# Patient Record
Sex: Female | Born: 1945
Health system: Southern US, Community
[De-identification: ages and names within clinical notes are randomized; demographics above are authoritative.]

## PROBLEM LIST (undated history)

## (undated) DIAGNOSIS — Z8489 Family history of other specified conditions: Secondary | ICD-10-CM

## (undated) DIAGNOSIS — J302 Other seasonal allergic rhinitis: Secondary | ICD-10-CM

## (undated) DIAGNOSIS — E785 Hyperlipidemia, unspecified: Secondary | ICD-10-CM

## (undated) DIAGNOSIS — I82409 Acute embolism and thrombosis of unspecified deep veins of unspecified lower extremity: Secondary | ICD-10-CM

## (undated) DIAGNOSIS — C801 Malignant (primary) neoplasm, unspecified: Secondary | ICD-10-CM

## (undated) DIAGNOSIS — M1990 Unspecified osteoarthritis, unspecified site: Secondary | ICD-10-CM

## (undated) DIAGNOSIS — F32A Depression, unspecified: Secondary | ICD-10-CM

## (undated) DIAGNOSIS — I10 Essential (primary) hypertension: Secondary | ICD-10-CM

## (undated) DIAGNOSIS — F329 Major depressive disorder, single episode, unspecified: Secondary | ICD-10-CM

## (undated) DIAGNOSIS — E119 Type 2 diabetes mellitus without complications: Secondary | ICD-10-CM

## (undated) DIAGNOSIS — I82401 Acute embolism and thrombosis of unspecified deep veins of right lower extremity: Secondary | ICD-10-CM

## (undated) HISTORY — DX: Type 2 diabetes mellitus without complications: E11.9

## (undated) HISTORY — PX: TONSILLECTOMY: SUR1361

## (undated) HISTORY — DX: Major depressive disorder, single episode, unspecified: F32.9

## (undated) HISTORY — PX: COLONOSCOPY W/ POLYPECTOMY: SHX1380

## (undated) HISTORY — DX: Hyperlipidemia, unspecified: E78.5

## (undated) HISTORY — DX: Essential (primary) hypertension: I10

## (undated) HISTORY — PX: OTHER SURGICAL HISTORY: SHX169

## (undated) HISTORY — DX: Depression, unspecified: F32.A

## (undated) HISTORY — DX: Acute embolism and thrombosis of unspecified deep veins of unspecified lower extremity: I82.409

## (undated) HISTORY — DX: Acute embolism and thrombosis of unspecified deep veins of right lower extremity: I82.401

---

## 1967-12-17 HISTORY — PX: DILATION AND CURETTAGE OF UTERUS: SHX78

## 1973-12-16 HISTORY — PX: VAGINAL HYSTERECTOMY: SUR661

## 1990-12-16 HISTORY — PX: LUMBAR SPINE SURGERY: SHX701

## 2001-06-12 ENCOUNTER — Ambulatory Visit (HOSPITAL_COMMUNITY): Admission: RE | Admit: 2001-06-12 | Discharge: 2001-06-12 | Payer: Self-pay | Admitting: Family Medicine

## 2001-06-12 ENCOUNTER — Encounter: Payer: Self-pay | Admitting: Family Medicine

## 2002-11-04 ENCOUNTER — Encounter: Payer: Self-pay | Admitting: Family Medicine

## 2002-11-04 ENCOUNTER — Ambulatory Visit (HOSPITAL_COMMUNITY): Admission: RE | Admit: 2002-11-04 | Discharge: 2002-11-04 | Payer: Self-pay | Admitting: Physical Therapy

## 2004-04-02 ENCOUNTER — Ambulatory Visit (HOSPITAL_COMMUNITY): Admission: RE | Admit: 2004-04-02 | Discharge: 2004-04-02 | Payer: Self-pay | Admitting: Family Medicine

## 2004-04-11 ENCOUNTER — Ambulatory Visit (HOSPITAL_COMMUNITY): Admission: RE | Admit: 2004-04-11 | Discharge: 2004-04-11 | Payer: Self-pay | Admitting: Family Medicine

## 2005-06-24 ENCOUNTER — Ambulatory Visit (HOSPITAL_COMMUNITY): Admission: RE | Admit: 2005-06-24 | Discharge: 2005-06-24 | Payer: Self-pay | Admitting: Family Medicine

## 2006-07-02 ENCOUNTER — Ambulatory Visit (HOSPITAL_COMMUNITY): Admission: RE | Admit: 2006-07-02 | Discharge: 2006-07-02 | Payer: Self-pay | Admitting: Family Medicine

## 2007-08-04 ENCOUNTER — Ambulatory Visit (HOSPITAL_COMMUNITY): Admission: RE | Admit: 2007-08-04 | Discharge: 2007-08-04 | Payer: Self-pay | Admitting: Family Medicine

## 2008-10-19 ENCOUNTER — Ambulatory Visit (HOSPITAL_COMMUNITY): Admission: RE | Admit: 2008-10-19 | Discharge: 2008-10-19 | Payer: Self-pay | Admitting: Family Medicine

## 2008-11-03 ENCOUNTER — Ambulatory Visit (HOSPITAL_COMMUNITY): Admission: RE | Admit: 2008-11-03 | Discharge: 2008-11-03 | Payer: Self-pay | Admitting: Family Medicine

## 2011-01-06 ENCOUNTER — Encounter: Payer: Self-pay | Admitting: Family Medicine

## 2013-09-15 ENCOUNTER — Other Ambulatory Visit (HOSPITAL_COMMUNITY): Payer: Self-pay | Admitting: Family Medicine

## 2013-09-15 DIAGNOSIS — Z139 Encounter for screening, unspecified: Secondary | ICD-10-CM

## 2013-09-21 ENCOUNTER — Ambulatory Visit (HOSPITAL_COMMUNITY)
Admission: RE | Admit: 2013-09-21 | Discharge: 2013-09-21 | Disposition: A | Discharge status: Home / Self Care | Payer: Medicare Other | Source: Ambulatory Visit | Attending: Family Medicine | Admitting: Family Medicine

## 2013-09-21 DIAGNOSIS — Z139 Encounter for screening, unspecified: Secondary | ICD-10-CM

## 2013-09-21 DIAGNOSIS — Z1231 Encounter for screening mammogram for malignant neoplasm of breast: Secondary | ICD-10-CM | POA: Insufficient documentation

## 2015-02-03 ENCOUNTER — Other Ambulatory Visit (HOSPITAL_COMMUNITY): Payer: Self-pay | Admitting: Family Medicine

## 2015-02-03 DIAGNOSIS — Z1231 Encounter for screening mammogram for malignant neoplasm of breast: Secondary | ICD-10-CM

## 2015-02-09 ENCOUNTER — Ambulatory Visit (HOSPITAL_COMMUNITY)
Admission: RE | Admit: 2015-02-09 | Discharge: 2015-02-09 | Disposition: A | Discharge status: Home / Self Care | Payer: Medicare Other | Source: Ambulatory Visit | Attending: Family Medicine | Admitting: Family Medicine

## 2015-02-09 DIAGNOSIS — Z1231 Encounter for screening mammogram for malignant neoplasm of breast: Secondary | ICD-10-CM | POA: Diagnosis not present

## 2015-02-10 ENCOUNTER — Other Ambulatory Visit: Payer: Self-pay | Admitting: Family Medicine

## 2015-02-10 DIAGNOSIS — R928 Other abnormal and inconclusive findings on diagnostic imaging of breast: Secondary | ICD-10-CM

## 2015-02-21 ENCOUNTER — Ambulatory Visit (HOSPITAL_COMMUNITY)
Admission: RE | Admit: 2015-02-21 | Discharge: 2015-02-21 | Disposition: A | Discharge status: Home / Self Care | Payer: Medicare Other | Source: Ambulatory Visit | Attending: Family Medicine | Admitting: Family Medicine

## 2015-02-21 ENCOUNTER — Other Ambulatory Visit: Payer: Self-pay | Admitting: Family Medicine

## 2015-02-21 DIAGNOSIS — R928 Other abnormal and inconclusive findings on diagnostic imaging of breast: Secondary | ICD-10-CM

## 2015-02-21 DIAGNOSIS — N641 Fat necrosis of breast: Secondary | ICD-10-CM | POA: Diagnosis not present

## 2015-10-06 ENCOUNTER — Ambulatory Visit: Payer: Medicare Other | Admitting: Cardiovascular Disease

## 2015-10-12 ENCOUNTER — Ambulatory Visit (INDEPENDENT_AMBULATORY_CARE_PROVIDER_SITE_OTHER): Payer: Medicare Other | Admitting: Cardiology

## 2015-10-12 ENCOUNTER — Encounter: Payer: Self-pay | Admitting: Cardiology

## 2015-10-12 VITALS — BP 104/68 | HR 73 | Ht 68.0 in | Wt 231.2 lb

## 2015-10-12 DIAGNOSIS — Z01810 Encounter for preprocedural cardiovascular examination: Secondary | ICD-10-CM | POA: Diagnosis not present

## 2015-10-12 DIAGNOSIS — I10 Essential (primary) hypertension: Secondary | ICD-10-CM | POA: Diagnosis not present

## 2015-10-12 DIAGNOSIS — E119 Type 2 diabetes mellitus without complications: Secondary | ICD-10-CM

## 2015-10-12 DIAGNOSIS — R072 Precordial pain: Secondary | ICD-10-CM | POA: Diagnosis not present

## 2015-10-12 DIAGNOSIS — R0609 Other forms of dyspnea: Secondary | ICD-10-CM | POA: Diagnosis not present

## 2015-10-12 DIAGNOSIS — R9431 Abnormal electrocardiogram [ECG] [EKG]: Secondary | ICD-10-CM

## 2015-10-12 DIAGNOSIS — E782 Mixed hyperlipidemia: Secondary | ICD-10-CM

## 2015-10-12 NOTE — Progress Notes (Signed)
Cardiology Office Note  Date: 10/12/2015   ID: Sarah Nguyen, DOB Apr 19, 1946, MRN [id number]  PCP: Jalene Mullet, PA-C  Consulting Cardiologist: [name], MD   Chief Complaint  Patient presents with  . Preoperative cardiac evaluation    History of Present Illness: Sarah Nguyen is a 69 y.o. female referred for cardiology consultation by Ms. Georgianne Fick. Preoperative cardiac evaluation has also been requested by [facility]. Records indicate plans for the patient to undergo lumbar laminectomy and foraminotomy with Dr. Ellene Route due to lumbar spinal stenosis. This procedure has not yet been scheduled.  She reports having had trouble since the early summer with fatigue and increasing dyspnea on exertion as well as intermittent chest tightness. She initially attributed this to the hot weather, but symptoms have not completely resolved. She has also had a dry cough. Her primary care provider has been evaluating the symptoms and recently obtained an echocardiogram which is reviewed below. We discussed the results today. Overall the test was reassuring, she does have mild LVH and mild diastolic dysfunction which would be consistent with her history of hypertension and type 2 diabetes mellitus, and does not necessarily explain her symptoms.  She does not endorse any known history of CAD or myocardial infarction. The only history includes diabetes and stroke, but not necessarily premature CAD based on discussion today. She has not undergone recent ischemic evaluation based on record review.  She is limited by lower back pain and spinal stenosis, cannot ambulate consistently for any long distance.  We reviewed her medications which are outlined below. Most recent addition was metoprolol. She states that her cough has resolved.   Past Medical History  Diagnosis Date  . Type 2 diabetes mellitus ([place])   . Hyperlipidemia   . Essential hypertension   . Depression     Past  Surgical History  Procedure Laterality Date  . Lumbar spine surgery  1992  . Vaginal hysterectomy  1975  . Tonsillectomy  69 years old  . Ingrown toenail removal  69 years old  . Dilation and curettage of uterus  1969    Current Outpatient Prescriptions  Medication Sig Dispense Refill  . atorvastatin (LIPITOR) 20 MG tablet Take 20 mg by mouth daily.    Marland Kitchen losartan-hydrochlorothiazide (HYZAAR) 50-12.5 MG tablet Take 1 tablet by mouth daily.    . metFORMIN (GLUCOPHAGE) 500 MG tablet Take 500 mg by mouth 2 (two) times daily with a meal.    . metoprolol (LOPRESSOR) 50 MG tablet Take 50 mg by mouth 2 (two) times daily.     No current facility-administered medications for this visit.    Allergies:  Codeine   Social History: The patient  reports that she has never smoked. She does not have any smokeless tobacco history on file. She reports that she does not drink alcohol or use illicit drugs.   Family History: The patient's family history includes Arrhythmia in her mother; Cancer in her mother; Diabetes Mellitus II in her mother; Melanoma in her father; Stroke in her brother and mother.   ROS:  Please see the history of present illness. Otherwise, complete review of systems is positive for back pain.  All other systems are reviewed and negative.   Physical Exam: VS:  BP 104/68 mmHg  Pulse 73  Ht 5\' 8"  (1.727 m)  Wt 231 lb 3.2 oz (104.872 kg)  BMI 35.16 kg/m2  SpO2 92%, BMI Body mass index is 35.16 kg/(m^2).  Wt Readings from  Last 3 Encounters:  10/12/15 231 lb 3.2 oz (104.872 kg)     General: Overweight woman, appears comfortable at rest. HEENT: Conjunctiva and lids normal, oropharynx clear. Neck: Supple, no elevated JVP or carotid bruits, no thyromegaly. Lungs: Clear to auscultation, nonlabored breathing at rest. Cardiac: Regular rate and rhythm, no S3 or significant systolic murmur, no pericardial rub. Abdomen: Soft, nontender, bowel sounds present, no guarding or  rebound. Extremities: No pitting edema, distal pulses 2+. Skin: Warm and dry. Musculoskeletal: No kyphosis. Neuropsychiatric: Alert and oriented x3, affect grossly appropriate.   ECG: Copy of tracing from 09/20/2015 shows sinus rhythm with decreased R wave progression.  Recent Labwork:  September 2016: Hemoglobin 13.0, platelets 272, TSH 2.5.  Other Studies Reviewed Today:  Echocardiogram 09/26/2015 [facility]) reported mild LVH with LVEF 62-26%, grade 1 diastolic dysfunction, mild aortic sclerosis without stenosis, MAC with trace mitral regurgitation, trace tricuspid regurgitation, unable to assess RVSP, no pericardial effusion.  ASSESSMENT AND PLAN:  1. Preoperative evaluation in a 69 year old woman with hypertension, type 2 diabetes mellitus, hyperlipidemia, and recent symptoms including fatigue, exertional shortness of breath, and intermittent chest tightness. Recent echocardiogram shows mild LVH with mild diastolic dysfunction and preserved LVEF, no significant valvular abnormalities. ECG abnormal with decreased R wave progression, but no history of known CAD or myocardial infarction. To better clarify her symptoms, we discussed proceeding with formal ischemic evaluation. A Lexiscan Cardiolite will be arranged, she has spinal stenosis and would have difficulty managing the treadmill at this time.  2. Essential hypertension, blood pressure control is good today.  3. Type 2 diabetes mellitus, on metformin.  4. Hyperlipidemia, on Lipitor.  Current medicines were reviewed at length with the patient today.   Orders Placed This Encounter  Procedures  . Myocardial Perfusion Imaging    Disposition: Call with results.   Signed, Satira Sark, MD, [facility] 10/12/2015 10:16 AM    [place] [facility] [address] [place], [place] [address] Phone: [phone]; Fax: [phone]

## 2015-10-12 NOTE — Patient Instructions (Signed)
Your physician recommends that you schedule a follow-up appointment in: to be determined after testing. We will call you with results   Your physician recommends that you continue on your current medications as directed. Please refer to the Current Medication list given to you today.   If you need a refill on your cardiac medications before your next appointment, please call your pharmacy.   Your physician has requested that you have a lexiscan myoview. For further information please visit [email or web address]. Please follow instruction sheet, as given.    Thank you for choosing [facility] !

## 2015-10-18 ENCOUNTER — Other Ambulatory Visit: Payer: Self-pay | Admitting: *Deleted

## 2015-10-18 DIAGNOSIS — R0789 Other chest pain: Secondary | ICD-10-CM

## 2015-10-19 ENCOUNTER — Encounter (HOSPITAL_COMMUNITY)
Admission: RE | Admit: 2015-10-19 | Discharge: 2015-10-19 | Disposition: A | Discharge status: Home / Self Care | Payer: Medicare Other | Source: Ambulatory Visit | Attending: Cardiology | Admitting: Cardiology

## 2015-10-19 ENCOUNTER — Encounter (HOSPITAL_COMMUNITY): Payer: Self-pay

## 2015-10-19 ENCOUNTER — Inpatient Hospital Stay (HOSPITAL_COMMUNITY): Admission: RE | Admit: 2015-10-19 | Payer: Medicare Other | Source: Ambulatory Visit

## 2015-10-19 DIAGNOSIS — R0789 Other chest pain: Secondary | ICD-10-CM | POA: Diagnosis present

## 2015-10-19 LAB — NM MYOCAR MULTI W/SPECT W/WALL MOTION / EF
CHL CUP NUCLEAR SDS: 1
CHL CUP NUCLEAR SRS: 0
CHL CUP RESTING HR STRESS: 75 {beats}/min
LHR: 0.34
LV sys vol: 25 mL
LVDIAVOL: 64 mL
Peak HR: 109 {beats}/min
SSS: 1
TID: 1.16

## 2015-10-19 MED ORDER — SODIUM CHLORIDE 0.9 % IJ SOLN
INTRAMUSCULAR | Status: AC
  Administered 2015-10-19: 10 mL via INTRAVENOUS
  Filled 2015-10-19: qty 3

## 2015-10-19 MED ORDER — REGADENOSON 0.4 MG/5ML IV SOLN
INTRAVENOUS | Status: AC
  Administered 2015-10-19: 0.4 mg via INTRAVENOUS
  Filled 2015-10-19: qty 5

## 2015-10-19 MED ORDER — TECHNETIUM TC 99M SESTAMIBI - CARDIOLITE
10.0000 | Freq: Once | INTRAVENOUS | Status: AC | PRN
  Administered 2015-10-19: 9.4 via INTRAVENOUS

## 2015-10-19 MED ORDER — TECHNETIUM TC 99M SESTAMIBI GENERIC - CARDIOLITE
30.0000 | Freq: Once | INTRAVENOUS | Status: AC | PRN
  Administered 2015-10-19: 31.8 via INTRAVENOUS

## 2015-10-20 ENCOUNTER — Telehealth: Payer: Self-pay | Admitting: *Deleted

## 2015-10-20 NOTE — Telephone Encounter (Signed)
-----   Message from Satira Sark, MD sent at 10/19/2015  3:42 PM EDT ----- Reviewed report. Please let patient know that the stress test looked good, low risk results. This suggests that she be able to proceed with planned surgery at an acceptable, low perioperative cardiac risk. Please forward copy to Dr. Ellene Route and also PCP.

## 2015-10-20 NOTE — Telephone Encounter (Signed)
Patient informed and copy sent to Dr. Ellene Route and PCP.

## 2016-01-30 DIAGNOSIS — N189 Chronic kidney disease, unspecified: Secondary | ICD-10-CM | POA: Diagnosis not present

## 2016-01-30 DIAGNOSIS — R5382 Chronic fatigue, unspecified: Secondary | ICD-10-CM | POA: Diagnosis not present

## 2016-01-30 DIAGNOSIS — E119 Type 2 diabetes mellitus without complications: Secondary | ICD-10-CM | POA: Diagnosis not present

## 2016-01-30 DIAGNOSIS — I10 Essential (primary) hypertension: Secondary | ICD-10-CM | POA: Diagnosis not present

## 2016-01-30 DIAGNOSIS — E7800 Pure hypercholesterolemia, unspecified: Secondary | ICD-10-CM | POA: Diagnosis not present

## 2016-02-13 DIAGNOSIS — R05 Cough: Secondary | ICD-10-CM | POA: Diagnosis not present

## 2016-02-13 DIAGNOSIS — N189 Chronic kidney disease, unspecified: Secondary | ICD-10-CM | POA: Diagnosis not present

## 2016-02-13 DIAGNOSIS — Z6836 Body mass index (BMI) 36.0-36.9, adult: Secondary | ICD-10-CM | POA: Diagnosis not present

## 2016-02-13 DIAGNOSIS — M2550 Pain in unspecified joint: Secondary | ICD-10-CM | POA: Diagnosis not present

## 2016-02-13 DIAGNOSIS — E118 Type 2 diabetes mellitus with unspecified complications: Secondary | ICD-10-CM | POA: Diagnosis not present

## 2016-02-13 DIAGNOSIS — I10 Essential (primary) hypertension: Secondary | ICD-10-CM | POA: Diagnosis not present

## 2016-02-13 DIAGNOSIS — E119 Type 2 diabetes mellitus without complications: Secondary | ICD-10-CM | POA: Diagnosis not present

## 2016-02-13 DIAGNOSIS — R1310 Dysphagia, unspecified: Secondary | ICD-10-CM | POA: Diagnosis not present

## 2016-02-13 DIAGNOSIS — F321 Major depressive disorder, single episode, moderate: Secondary | ICD-10-CM | POA: Diagnosis not present

## 2016-02-15 ENCOUNTER — Other Ambulatory Visit (HOSPITAL_COMMUNITY): Payer: Self-pay | Admitting: Family Medicine

## 2016-02-15 DIAGNOSIS — R1310 Dysphagia, unspecified: Secondary | ICD-10-CM

## 2016-02-20 ENCOUNTER — Other Ambulatory Visit: Payer: Self-pay | Admitting: Neurological Surgery

## 2016-02-22 ENCOUNTER — Other Ambulatory Visit (HOSPITAL_COMMUNITY): Payer: Medicare Other

## 2016-02-29 ENCOUNTER — Other Ambulatory Visit (HOSPITAL_COMMUNITY): Payer: Medicare Other

## 2016-03-07 DIAGNOSIS — M4806 Spinal stenosis, lumbar region: Secondary | ICD-10-CM | POA: Diagnosis not present

## 2016-03-07 DIAGNOSIS — Z6835 Body mass index (BMI) 35.0-35.9, adult: Secondary | ICD-10-CM | POA: Diagnosis not present

## 2016-03-15 NOTE — Pre-Procedure Instructions (Signed)
Sarah Nguyen  03/15/2016     Your procedure is scheduled on : Tuesday March 26, 2016 at 12:05 PM.  Report to [facility] Admitting at 9:15 AM.  Call this number if you have problems the morning of surgery: [phone]    Remember:  Do not eat food or drink liquids after midnight.  Take these medicines the morning of surgery with A SIP OF WATER : NONE   Stop taking any vitamins, herbal medications, NSAIDs, Ibuprofen, Advil, Motrin, Aleve, etc on Tuesday April 4th   Do  NOT take any diabetic pills the morning of your surgery (NO Metformin/Glucophage)     How to Manage Your Diabetes Before and After Surgery  Why is it important to control my blood sugar before and after surgery? . Improving blood sugar levels before and after surgery helps healing and can limit problems. . A way of improving blood sugar control is eating a healthy diet by: o  Eating less sugar and carbohydrates o  Increasing activity/exercise o  Talking with your doctor about reaching your blood sugar goals . High blood sugars (greater than 180 mg/dL) can raise your risk of infections and slow your recovery, so you will need to focus on controlling your diabetes during the weeks before surgery. . Make sure that the doctor who takes care of your diabetes knows about your planned surgery including the date and location.  How do I manage my blood sugar before surgery? . Check your blood sugar at least 4 times a day, starting 2 days before surgery, to make sure that the level is not too high or low. o Check your blood sugar the morning of your surgery when you wake up and every 2 hours until you get to the Short Stay unit. . If your blood sugar is less than 70 mg/dL, you will need to treat for low blood sugar: o Do not take insulin. o Treat a low blood sugar (less than 70 mg/dL) with  cup of clear juice (cranberry or apple), 4 glucose tablets, OR glucose gel. o Recheck blood sugar in 15 minutes after  treatment (to make sure it is greater than 70 mg/dL). If your blood sugar is not greater than 70 mg/dL on recheck, call [phone] for further instructions. . Report your blood sugar to the short stay nurse when you get to Short Stay.  . If you are admitted to the hospital after surgery: o Your blood sugar will be checked by the staff and you will probably be given insulin after surgery (instead of oral diabetes medicines) to make sure you have good blood sugar levels. o The goal for blood sugar control after surgery is 80-180 mg/dL.      WHAT DO I DO ABOUT MY DIABETES MEDICATION?   Marland Kitchen Do not take oral diabetes medicines (pills) the morning of surgery.   Other Instructions:          Patient Signature:  Date:   Nurse Signature:  Date:   Reviewed and Endorsed by [facility] Patient Education Committee, August 2015   Do not wear jewelry, make-up or nail polish.  Do not wear lotions, powders, or perfumes.    Do not shave 48 hours prior to surgery.    Do not bring valuables to the hospital.  [facility] is not responsible for any belongings or valuables.  Contacts, dentures or bridgework may not be worn into surgery.  Leave your suitcase in the car.  After surgery it may  be brought to your room.  For patients admitted to the hospital, discharge time will be determined by your treatment team.  Patients discharged the day of surgery will not be allowed to drive home.   Name and phone number of your driver:    Special instructions:  Shower using CHG soap the night before and the morning of your surgery  Please read over the following fact sheets that you were given. Pain Booklet, Coughing and Deep Breathing, MRSA Information and Surgical Site Infection Prevention

## 2016-03-18 ENCOUNTER — Encounter (HOSPITAL_COMMUNITY)
Admission: RE | Admit: 2016-03-18 | Discharge: 2016-03-18 | Disposition: A | Discharge status: Home / Self Care | Payer: PPO | Source: Ambulatory Visit | Attending: Neurological Surgery | Admitting: Neurological Surgery

## 2016-03-18 ENCOUNTER — Encounter (HOSPITAL_COMMUNITY): Payer: Self-pay

## 2016-03-18 DIAGNOSIS — M4806 Spinal stenosis, lumbar region: Secondary | ICD-10-CM | POA: Insufficient documentation

## 2016-03-18 DIAGNOSIS — Z01812 Encounter for preprocedural laboratory examination: Secondary | ICD-10-CM | POA: Insufficient documentation

## 2016-03-18 HISTORY — DX: Unspecified osteoarthritis, unspecified site: M19.90

## 2016-03-18 HISTORY — DX: Other seasonal allergic rhinitis: J30.2

## 2016-03-18 HISTORY — DX: Family history of other specified conditions: Z84.89

## 2016-03-18 HISTORY — DX: Malignant (primary) neoplasm, unspecified: C80.1

## 2016-03-18 LAB — BASIC METABOLIC PANEL
ANION GAP: 14 (ref 5–15)
BUN: 17 mg/dL (ref 6–20)
CALCIUM: 9.3 mg/dL (ref 8.9–10.3)
CO2: 22 mmol/L (ref 22–32)
Chloride: 106 mmol/L (ref 101–111)
Creatinine, Ser: 0.97 mg/dL (ref 0.44–1.00)
GFR, EST NON AFRICAN AMERICAN: 59 mL/min — AB (ref >60)
Glucose, Bld: 130 mg/dL — ABNORMAL HIGH (ref 65–99)
Potassium: 4 mmol/L (ref 3.5–5.1)
Sodium: 142 mmol/L (ref 135–145)

## 2016-03-18 LAB — GLUCOSE, CAPILLARY: GLUCOSE-CAPILLARY: 136 mg/dL — AB (ref 65–99)

## 2016-03-18 LAB — CBC
HEMATOCRIT: 41.8 % (ref 36.0–46.0)
Hemoglobin: 13.2 g/dL (ref 12.0–15.0)
MCH: 28.3 pg (ref 26.0–34.0)
MCHC: 31.6 g/dL (ref 30.0–36.0)
MCV: 89.7 fL (ref 78.0–100.0)
Platelets: 255 10*3/uL (ref 150–400)
RBC: 4.66 MIL/uL (ref 3.87–5.11)
RDW: 14.7 % (ref 11.5–15.5)
WBC: 4.7 10*3/uL (ref 4.0–10.5)

## 2016-03-18 LAB — SURGICAL PCR SCREEN
MRSA, PCR: NEGATIVE
STAPHYLOCOCCUS AUREUS: NEGATIVE

## 2016-03-18 NOTE — Progress Notes (Signed)
PCP is Kassie Mends, PA-C  Patient informed Nurse that she had a stress test because she was having some chest pain last year (2016) at [facility]. Patient also stated that she saw Dr. [name] (Cardiologist) and he ran several tests, however everything was "fine." Patient denied having any chest pain or shortness of breath since last year. Patient stated she had a chest xray last year at [facility]. Will request records.  Nurse inquired about blood glucose levels and patient informed Nurse that she does not check her blood sugar at home, nor was patient aware of what her last A1c level was.   Patient stated she takes all her medications at night.

## 2016-03-18 NOTE — Progress Notes (Signed)
   03/18/16 1339  OBSTRUCTIVE SLEEP APNEA  Have you ever been diagnosed with sleep apnea through a sleep study? No  Do you snore loudly (loud enough to be heard through closed doors)?  1  Do you often feel tired, fatigued, or sleepy during the daytime (such as falling asleep during driving or talking to someone)? 1  Has anyone observed you stop breathing during your sleep? 0  Do you have, or are you being treated for high blood pressure? 1  BMI more than 35 kg/m2? 1  Age > 50 (1-yes) 1  Neck circumference greater than:Female 16 inches or larger, Female 17inches or larger? 1  Female Gender (Yes=1) 0  Obstructive Sleep Apnea Score 6  Score 5 or greater  Results sent to PCP   This patient has screened at risk for sleep apnea using the STOP Bang tool used during a pre-surgical visit. A score of 4 or greater is at risk for sleep apnea.

## 2016-03-19 LAB — HEMOGLOBIN A1C
Hgb A1c MFr Bld: 6.6 % — ABNORMAL HIGH (ref 4.8–5.6)
Mean Plasma Glucose: 143 mg/dL

## 2016-03-20 NOTE — Progress Notes (Signed)
Re-requested CXR from [facility].

## 2016-03-25 MED ORDER — CEFAZOLIN SODIUM-DEXTROSE 2-4 GM/100ML-% IV SOLN
2.0000 g | INTRAVENOUS | Status: AC
  Administered 2016-03-26: 2 g via INTRAVENOUS

## 2016-03-26 ENCOUNTER — Inpatient Hospital Stay (HOSPITAL_COMMUNITY): Payer: PPO

## 2016-03-26 ENCOUNTER — Inpatient Hospital Stay (HOSPITAL_COMMUNITY)
Admission: RE | Admit: 2016-03-26 | Discharge: 2016-03-28 | DRG: 518 | Disposition: A | Discharge status: Home / Self Care | Payer: PPO | Source: Ambulatory Visit | Attending: Neurological Surgery | Admitting: Neurological Surgery

## 2016-03-26 ENCOUNTER — Encounter (HOSPITAL_COMMUNITY): Payer: Self-pay | Admitting: *Deleted

## 2016-03-26 ENCOUNTER — Encounter (HOSPITAL_COMMUNITY)
Admission: RE | Disposition: A | Discharge status: Home / Self Care | Payer: Self-pay | Source: Ambulatory Visit | Attending: Neurological Surgery

## 2016-03-26 ENCOUNTER — Inpatient Hospital Stay (HOSPITAL_COMMUNITY): Payer: PPO | Admitting: Critical Care Medicine

## 2016-03-26 DIAGNOSIS — M2550 Pain in unspecified joint: Secondary | ICD-10-CM | POA: Diagnosis present

## 2016-03-26 DIAGNOSIS — I10 Essential (primary) hypertension: Secondary | ICD-10-CM | POA: Diagnosis present

## 2016-03-26 DIAGNOSIS — M4180 Other forms of scoliosis, site unspecified: Secondary | ICD-10-CM | POA: Diagnosis not present

## 2016-03-26 DIAGNOSIS — M1990 Unspecified osteoarthritis, unspecified site: Secondary | ICD-10-CM | POA: Diagnosis not present

## 2016-03-26 DIAGNOSIS — M5416 Radiculopathy, lumbar region: Secondary | ICD-10-CM | POA: Diagnosis not present

## 2016-03-26 DIAGNOSIS — Z9889 Other specified postprocedural states: Secondary | ICD-10-CM | POA: Diagnosis not present

## 2016-03-26 DIAGNOSIS — M4806 Spinal stenosis, lumbar region: Secondary | ICD-10-CM | POA: Diagnosis not present

## 2016-03-26 DIAGNOSIS — E119 Type 2 diabetes mellitus without complications: Secondary | ICD-10-CM | POA: Diagnosis not present

## 2016-03-26 DIAGNOSIS — Z90710 Acquired absence of both cervix and uterus: Secondary | ICD-10-CM | POA: Diagnosis not present

## 2016-03-26 DIAGNOSIS — M961 Postlaminectomy syndrome, not elsewhere classified: Secondary | ICD-10-CM | POA: Diagnosis not present

## 2016-03-26 DIAGNOSIS — M79606 Pain in leg, unspecified: Secondary | ICD-10-CM | POA: Diagnosis not present

## 2016-03-26 DIAGNOSIS — E7800 Pure hypercholesterolemia, unspecified: Secondary | ICD-10-CM | POA: Diagnosis not present

## 2016-03-26 DIAGNOSIS — M48062 Spinal stenosis, lumbar region with neurogenic claudication: Secondary | ICD-10-CM | POA: Diagnosis present

## 2016-03-26 DIAGNOSIS — M4126 Other idiopathic scoliosis, lumbar region: Secondary | ICD-10-CM | POA: Diagnosis not present

## 2016-03-26 DIAGNOSIS — R0602 Shortness of breath: Secondary | ICD-10-CM | POA: Diagnosis present

## 2016-03-26 DIAGNOSIS — Z419 Encounter for procedure for purposes other than remedying health state, unspecified: Secondary | ICD-10-CM

## 2016-03-26 HISTORY — PX: LUMBAR LAMINECTOMY WITH COFLEX 2 LEVEL: SHX6515

## 2016-03-26 LAB — GLUCOSE, CAPILLARY
Glucose-Capillary: 95 mg/dL (ref 65–99)
Glucose-Capillary: 92 mg/dL (ref 65–99)
Glucose-Capillary: 108 mg/dL — ABNORMAL HIGH (ref 65–99)
Glucose-Capillary: 118 mg/dL — ABNORMAL HIGH (ref 65–99)

## 2016-03-26 SURGERY — LUMBAR LAMINECTOMY WITH COFLEX 2 LEVEL
Anesthesia: General | Site: Back

## 2016-03-26 MED ORDER — FENTANYL CITRATE (PF) 100 MCG/2ML IJ SOLN
INTRAMUSCULAR | Status: DC | PRN
  Administered 2016-03-26: 50 ug via INTRAVENOUS
  Administered 2016-03-26: 75 ug via INTRAVENOUS
  Administered 2016-03-26: 25 ug via INTRAVENOUS

## 2016-03-26 MED ORDER — HYDROCODONE-ACETAMINOPHEN 5-325 MG PO TABS: 1.0000 | ORAL_TABLET | ORAL | Status: DC | PRN

## 2016-03-26 MED ORDER — METHOCARBAMOL 500 MG PO TABS
500.0000 mg | ORAL_TABLET | Freq: Four times a day (QID) | ORAL | Status: DC | PRN
  Administered 2016-03-26 – 2016-03-28 (×4): 500 mg via ORAL
  Filled 2016-03-26 (×4): qty 1

## 2016-03-26 MED ORDER — ONDANSETRON HCL 4 MG/2ML IJ SOLN
INTRAMUSCULAR | Status: DC | PRN
  Administered 2016-03-26: 4 mg via INTRAVENOUS

## 2016-03-26 MED ORDER — MIDAZOLAM HCL 2 MG/2ML IJ SOLN
INTRAMUSCULAR | Status: AC
  Filled 2016-03-26: qty 2

## 2016-03-26 MED ORDER — METOPROLOL SUCCINATE ER 25 MG PO TB24
25.0000 mg | ORAL_TABLET | Freq: Every day | ORAL | Status: DC
  Administered 2016-03-26 – 2016-03-27 (×2): 25 mg via ORAL
  Filled 2016-03-26 (×2): qty 1

## 2016-03-26 MED ORDER — PHENYLEPHRINE HCL 10 MG/ML IJ SOLN
INTRAMUSCULAR | Status: DC | PRN
  Administered 2016-03-26: 80 ug via INTRAVENOUS
  Administered 2016-03-26 (×2): 120 ug via INTRAVENOUS
  Administered 2016-03-26: 80 ug via INTRAVENOUS

## 2016-03-26 MED ORDER — POLYETHYLENE GLYCOL 3350 17 G PO PACK: 17.0000 g | PACK | Freq: Every day | ORAL | Status: DC | PRN

## 2016-03-26 MED ORDER — ROCURONIUM BROMIDE 100 MG/10ML IV SOLN
INTRAVENOUS | Status: DC | PRN
  Administered 2016-03-26: 50 mg via INTRAVENOUS

## 2016-03-26 MED ORDER — THROMBIN 5000 UNITS EX SOLR
OROMUCOSAL | Status: DC | PRN
  Administered 2016-03-26: 10 mL via TOPICAL

## 2016-03-26 MED ORDER — HYDROCHLOROTHIAZIDE 12.5 MG PO CAPS
12.5000 mg | ORAL_CAPSULE | Freq: Every day | ORAL | Status: DC
  Administered 2016-03-26 – 2016-03-27 (×2): 12.5 mg via ORAL
  Filled 2016-03-26 (×2): qty 1

## 2016-03-26 MED ORDER — ALUM & MAG HYDROXIDE-SIMETH 200-200-20 MG/5ML PO SUSP: 30.0000 mL | Freq: Four times a day (QID) | ORAL | Status: DC | PRN

## 2016-03-26 MED ORDER — GLYCOPYRROLATE 0.2 MG/ML IJ SOLN
INTRAMUSCULAR | Status: AC
  Filled 2016-03-26: qty 2

## 2016-03-26 MED ORDER — PHENOL 1.4 % MT LIQD: 1.0000 | OROMUCOSAL | Status: DC | PRN

## 2016-03-26 MED ORDER — ATORVASTATIN CALCIUM 20 MG PO TABS
20.0000 mg | ORAL_TABLET | Freq: Every day | ORAL | Status: DC
  Administered 2016-03-26 – 2016-03-27 (×2): 20 mg via ORAL
  Filled 2016-03-26 (×2): qty 1

## 2016-03-26 MED ORDER — CEFAZOLIN SODIUM 1-5 GM-% IV SOLN
1.0000 g | Freq: Three times a day (TID) | INTRAVENOUS | Status: AC
  Administered 2016-03-26 – 2016-03-27 (×2): 1 g via INTRAVENOUS
  Filled 2016-03-26 (×2): qty 50

## 2016-03-26 MED ORDER — SODIUM CHLORIDE 0.9% FLUSH
3.0000 mL | Freq: Two times a day (BID) | INTRAVENOUS | Status: DC
  Administered 2016-03-27 (×2): 3 mL via INTRAVENOUS

## 2016-03-26 MED ORDER — PROPOFOL 10 MG/ML IV BOLUS
INTRAVENOUS | Status: DC | PRN
  Administered 2016-03-26: 170 mg via INTRAVENOUS

## 2016-03-26 MED ORDER — FENTANYL CITRATE (PF) 250 MCG/5ML IJ SOLN
INTRAMUSCULAR | Status: AC
  Filled 2016-03-26: qty 5

## 2016-03-26 MED ORDER — LIDOCAINE HCL (CARDIAC) 20 MG/ML IV SOLN
INTRAVENOUS | Status: DC | PRN
  Administered 2016-03-26: 80 mg via INTRAVENOUS

## 2016-03-26 MED ORDER — MORPHINE SULFATE (PF) 2 MG/ML IV SOLN: 1.0000 mg | INTRAVENOUS | Status: DC | PRN

## 2016-03-26 MED ORDER — SUGAMMADEX SODIUM 500 MG/5ML IV SOLN
INTRAVENOUS | Status: DC | PRN
  Administered 2016-03-26: 400 mg via INTRAVENOUS

## 2016-03-26 MED ORDER — ONDANSETRON HCL 4 MG/2ML IJ SOLN: 4.0000 mg | Freq: Once | INTRAMUSCULAR | Status: DC

## 2016-03-26 MED ORDER — LOSARTAN POTASSIUM 50 MG PO TABS
50.0000 mg | ORAL_TABLET | Freq: Every day | ORAL | Status: DC
  Administered 2016-03-26 – 2016-03-27 (×2): 50 mg via ORAL
  Filled 2016-03-26 (×2): qty 1

## 2016-03-26 MED ORDER — PHENYLEPHRINE HCL 10 MG/ML IJ SOLN
10.0000 mg | INTRAVENOUS | Status: DC | PRN
  Administered 2016-03-26: 25 ug/min via INTRAVENOUS

## 2016-03-26 MED ORDER — MENTHOL 3 MG MT LOZG
1.0000 | LOZENGE | OROMUCOSAL | Status: DC | PRN
  Filled 2016-03-26: qty 9

## 2016-03-26 MED ORDER — MIDAZOLAM HCL 5 MG/5ML IJ SOLN
INTRAMUSCULAR | Status: DC | PRN
  Administered 2016-03-26: 2 mg via INTRAVENOUS

## 2016-03-26 MED ORDER — BUPIVACAINE HCL (PF) 0.5 % IJ SOLN
INTRAMUSCULAR | Status: DC | PRN
  Administered 2016-03-26: 5 mL
  Administered 2016-03-26: 20 mL

## 2016-03-26 MED ORDER — SODIUM CHLORIDE 0.9 % IR SOLN
Status: DC | PRN
  Administered 2016-03-26: 500 mL

## 2016-03-26 MED ORDER — ACETAMINOPHEN 325 MG PO TABS
650.0000 mg | ORAL_TABLET | ORAL | Status: DC | PRN
  Administered 2016-03-28: 650 mg via ORAL
  Filled 2016-03-26: qty 2

## 2016-03-26 MED ORDER — DOCUSATE SODIUM 100 MG PO CAPS
100.0000 mg | ORAL_CAPSULE | Freq: Two times a day (BID) | ORAL | Status: DC
  Administered 2016-03-26 – 2016-03-28 (×4): 100 mg via ORAL
  Filled 2016-03-26 (×4): qty 1

## 2016-03-26 MED ORDER — LORATADINE 10 MG PO TABS
10.0000 mg | ORAL_TABLET | Freq: Every day | ORAL | Status: DC
  Administered 2016-03-26 – 2016-03-27 (×2): 10 mg via ORAL
  Filled 2016-03-26 (×2): qty 1

## 2016-03-26 MED ORDER — HYDROMORPHONE HCL 1 MG/ML IJ SOLN
0.2500 mg | INTRAMUSCULAR | Status: DC | PRN
  Administered 2016-03-26 (×2): 0.5 mg via INTRAVENOUS

## 2016-03-26 MED ORDER — BISACODYL 10 MG RE SUPP: 10.0000 mg | Freq: Every day | RECTAL | Status: DC | PRN

## 2016-03-26 MED ORDER — THROMBIN 5000 UNITS EX SOLR
CUTANEOUS | Status: DC | PRN
  Administered 2016-03-26 (×2): 5000 [IU] via TOPICAL

## 2016-03-26 MED ORDER — METHOCARBAMOL 1000 MG/10ML IJ SOLN
500.0000 mg | Freq: Four times a day (QID) | INTRAVENOUS | Status: DC | PRN
  Filled 2016-03-26: qty 5

## 2016-03-26 MED ORDER — SUCCINYLCHOLINE CHLORIDE 20 MG/ML IJ SOLN
INTRAMUSCULAR | Status: DC | PRN
  Administered 2016-03-26: 100 mg via INTRAVENOUS

## 2016-03-26 MED ORDER — PHENYLEPHRINE 40 MCG/ML (10ML) SYRINGE FOR IV PUSH (FOR BLOOD PRESSURE SUPPORT)
PREFILLED_SYRINGE | INTRAVENOUS | Status: AC
Start: 2016-03-26 — End: 2016-03-26
  Filled 2016-03-26: qty 10

## 2016-03-26 MED ORDER — HYDROMORPHONE HCL 1 MG/ML IJ SOLN
INTRAMUSCULAR | Status: AC
  Filled 2016-03-26: qty 1

## 2016-03-26 MED ORDER — SENNA 8.6 MG PO TABS
1.0000 | ORAL_TABLET | Freq: Two times a day (BID) | ORAL | Status: DC
  Administered 2016-03-26 – 2016-03-27 (×3): 8.6 mg via ORAL
  Filled 2016-03-26 (×3): qty 1

## 2016-03-26 MED ORDER — HEMOSTATIC AGENTS (NO CHARGE) OPTIME
TOPICAL | Status: DC | PRN
  Administered 2016-03-26: 1 via TOPICAL

## 2016-03-26 MED ORDER — SODIUM CHLORIDE 0.9% FLUSH: 3.0000 mL | INTRAVENOUS | Status: DC | PRN

## 2016-03-26 MED ORDER — OXYCODONE-ACETAMINOPHEN 5-325 MG PO TABS
1.0000 | ORAL_TABLET | ORAL | Status: DC | PRN
  Administered 2016-03-26 – 2016-03-28 (×6): 2 via ORAL
  Filled 2016-03-26 (×8): qty 2

## 2016-03-26 MED ORDER — ACETAMINOPHEN 650 MG RE SUPP: 650.0000 mg | RECTAL | Status: DC | PRN

## 2016-03-26 MED ORDER — LOSARTAN POTASSIUM-HCTZ 50-12.5 MG PO TABS: 1.0000 | ORAL_TABLET | Freq: Every day | ORAL | Status: DC

## 2016-03-26 MED ORDER — ONDANSETRON HCL 4 MG/2ML IJ SOLN
4.0000 mg | INTRAMUSCULAR | Status: DC | PRN
Start: 2016-03-26 — End: 2016-03-28

## 2016-03-26 MED ORDER — SODIUM CHLORIDE 0.9 % IV SOLN: 250.0000 mL | INTRAVENOUS | Status: DC

## 2016-03-26 MED ORDER — LACTATED RINGERS IV SOLN
INTRAVENOUS | Status: DC
  Administered 2016-03-26 (×3): via INTRAVENOUS

## 2016-03-26 MED ORDER — FLEET ENEMA 7-19 GM/118ML RE ENEM: 1.0000 | ENEMA | Freq: Once | RECTAL | Status: DC | PRN

## 2016-03-26 MED ORDER — PROPOFOL 10 MG/ML IV BOLUS
INTRAVENOUS | Status: AC
  Filled 2016-03-26: qty 20

## 2016-03-26 MED ORDER — KETOROLAC TROMETHAMINE 15 MG/ML IJ SOLN
15.0000 mg | Freq: Four times a day (QID) | INTRAMUSCULAR | Status: AC
  Administered 2016-03-26 – 2016-03-27 (×5): 15 mg via INTRAVENOUS
  Filled 2016-03-26 (×5): qty 1

## 2016-03-26 MED ORDER — LIDOCAINE-EPINEPHRINE 1 %-1:100000 IJ SOLN
INTRAMUSCULAR | Status: DC | PRN
  Administered 2016-03-26: 5 mL

## 2016-03-26 MED ORDER — METFORMIN HCL ER 500 MG PO TB24
1000.0000 mg | ORAL_TABLET | Freq: Every day | ORAL | Status: DC
  Administered 2016-03-26 – 2016-03-27 (×2): 1000 mg via ORAL
  Filled 2016-03-26 (×2): qty 2

## 2016-03-26 MED ORDER — 0.9 % SODIUM CHLORIDE (POUR BTL) OPTIME
TOPICAL | Status: DC | PRN
  Administered 2016-03-26: 1000 mL

## 2016-03-26 MED ORDER — PANTOPRAZOLE SODIUM 40 MG PO TBEC
40.0000 mg | DELAYED_RELEASE_TABLET | Freq: Every evening | ORAL | Status: DC
Start: 2016-03-26 — End: 2016-03-28
  Filled 2016-03-26: qty 1

## 2016-03-26 SURGICAL SUPPLY — 53 items
ADH SKN CLS APL DERMABOND .7 (GAUZE/BANDAGES/DRESSINGS) ×1
BAG DECANTER FOR FLEXI CONT (MISCELLANEOUS) ×3 IMPLANT
BLADE CLIPPER SURG (BLADE) IMPLANT
BUR ACORN 6.0 (BURR) IMPLANT
BUR ACORN 6.0MM (BURR)
BUR MATCHSTICK NEURO 3.0 LAGG (BURR) ×3 IMPLANT
CANISTER SUCT 3000ML PPV (MISCELLANEOUS) ×3 IMPLANT
DECANTER SPIKE VIAL GLASS SM (MISCELLANEOUS) ×3 IMPLANT
DERMABOND ADVANCED (GAUZE/BANDAGES/DRESSINGS) ×2
DERMABOND ADVANCED .7 DNX12 (GAUZE/BANDAGES/DRESSINGS) ×1 IMPLANT
DEVICE COFLEX STABLIZATION 8MM (Neuro Prosthesis/Implant) ×6 IMPLANT
DRAPE C-ARM 42X72 X-RAY (DRAPES) ×6 IMPLANT
DRAPE LAPAROTOMY T 102X78X121 (DRAPES) ×3 IMPLANT
DRAPE MICROSCOPE LEICA (MISCELLANEOUS) IMPLANT
DRAPE POUCH INSTRU U-SHP 10X18 (DRAPES) ×3 IMPLANT
DRAPE PROXIMA HALF (DRAPES) IMPLANT
DRSG OPSITE POSTOP 4X10 (GAUZE/BANDAGES/DRESSINGS) ×2 IMPLANT
DRSG OPSITE POSTOP 4X8 (GAUZE/BANDAGES/DRESSINGS) ×2 IMPLANT
DURAPREP 26ML APPLICATOR (WOUND CARE) ×3 IMPLANT
ELECT REM PT RETURN 9FT ADLT (ELECTROSURGICAL) ×3
ELECTRODE REM PT RTRN 9FT ADLT (ELECTROSURGICAL) ×1 IMPLANT
GAUZE SPONGE 4X4 12PLY STRL (GAUZE/BANDAGES/DRESSINGS) ×3 IMPLANT
GAUZE SPONGE 4X4 16PLY XRAY LF (GAUZE/BANDAGES/DRESSINGS) IMPLANT
GLOVE BIOGEL PI IND STRL 8.5 (GLOVE) ×1 IMPLANT
GLOVE BIOGEL PI INDICATOR 8.5 (GLOVE) ×2
GLOVE ECLIPSE 8.5 STRL (GLOVE) ×3 IMPLANT
GLOVE EXAM NITRILE LRG STRL (GLOVE) IMPLANT
GLOVE EXAM NITRILE MD LF STRL (GLOVE) IMPLANT
GLOVE EXAM NITRILE XL STR (GLOVE) IMPLANT
GLOVE EXAM NITRILE XS STR PU (GLOVE) IMPLANT
GOWN STRL REUS W/ TWL LRG LVL3 (GOWN DISPOSABLE) IMPLANT
GOWN STRL REUS W/ TWL XL LVL3 (GOWN DISPOSABLE) IMPLANT
GOWN STRL REUS W/TWL 2XL LVL3 (GOWN DISPOSABLE) ×3 IMPLANT
GOWN STRL REUS W/TWL LRG LVL3 (GOWN DISPOSABLE)
GOWN STRL REUS W/TWL XL LVL3 (GOWN DISPOSABLE)
KIT BASIN OR (CUSTOM PROCEDURE TRAY) ×3 IMPLANT
KIT ROOM TURNOVER OR (KITS) ×3 IMPLANT
NDL SPNL 20GX3.5 QUINCKE YW (NEEDLE) IMPLANT
NEEDLE HYPO 22GX1.5 SAFETY (NEEDLE) ×3 IMPLANT
NEEDLE SPNL 20GX3.5 QUINCKE YW (NEEDLE) IMPLANT
NS IRRIG 1000ML POUR BTL (IV SOLUTION) ×3 IMPLANT
PACK LAMINECTOMY NEURO (CUSTOM PROCEDURE TRAY) ×3 IMPLANT
PAD ARMBOARD 7.5X6 YLW CONV (MISCELLANEOUS) ×9 IMPLANT
PATTIES SURGICAL .5 X1 (DISPOSABLE) ×3 IMPLANT
RUBBERBAND STERILE (MISCELLANEOUS) IMPLANT
SPONGE SURGIFOAM ABS GEL SZ50 (HEMOSTASIS) ×3 IMPLANT
SUT VIC AB 1 CT1 18XBRD ANBCTR (SUTURE) ×1 IMPLANT
SUT VIC AB 1 CT1 8-18 (SUTURE) ×3
SUT VIC AB 2-0 CP2 18 (SUTURE) ×3 IMPLANT
SUT VIC AB 3-0 SH 8-18 (SUTURE) ×9 IMPLANT
TOWEL OR 17X24 6PK STRL BLUE (TOWEL DISPOSABLE) ×3 IMPLANT
TOWEL OR 17X26 10 PK STRL BLUE (TOWEL DISPOSABLE) ×3 IMPLANT
WATER STERILE IRR 1000ML POUR (IV SOLUTION) ×3 IMPLANT

## 2016-03-26 NOTE — Progress Notes (Signed)
Orthopedic Tech Progress Note Patient Details:  [name] [birth year] [id number] Patient was a pre-fit. Patient ID: Sarah Nguyen, female   DOB: 04/01/1946, 70 y.o.   MRN: [id number]   Braulio Bosch 03/26/2016, 6:51 PM

## 2016-03-26 NOTE — Progress Notes (Signed)
Patient ID: Sarah Nguyen, female   DOB: 1946/08/28, 70 y.o.   MRN: [id number] Vital signs are stable Motor function appears intact Fully catheter just removed Doing well postop

## 2016-03-26 NOTE — Anesthesia Preprocedure Evaluation (Signed)
Anesthesia Evaluation  Patient identified by MRN, date of birth, ID band Patient awake    Airway Mallampati: II  TM Distance: <3 FB Neck ROM: Full  Mouth opening: Limited Mouth Opening  Dental  (+) Teeth Intact, Dental Advisory Given,    Pulmonary neg pulmonary ROS,    breath sounds clear to auscultation       Cardiovascular hypertension,  Rhythm:Regular Rate:Normal     Neuro/Psych negative neurological ROS     GI/Hepatic negative GI ROS, Neg liver ROS,   Endo/Other  diabetesMorbid obesity  Renal/GU negative Renal ROS     Musculoskeletal  (+) Arthritis ,   Abdominal (+) + obese,   Peds  Hematology   Anesthesia Other Findings   Reproductive/Obstetrics                             Anesthesia Physical Anesthesia Plan  ASA: III  Anesthesia Plan: General   Post-op Pain Management:    Induction: Intravenous  Airway Management Planned: Oral ETT and Video Laryngoscope Planned  Additional Equipment:   Intra-op Plan:   Post-operative Plan: Extubation in OR  Informed Consent: I have reviewed the patients History and Physical, chart, labs and discussed the procedure including the risks, benefits and alternatives for the proposed anesthesia with the patient or authorized representative who has indicated his/her understanding and acceptance.   Dental advisory given  Plan Discussed with: CRNA and Surgeon  Anesthesia Plan Comments:         Anesthesia Quick Evaluation

## 2016-03-26 NOTE — Anesthesia Procedure Notes (Signed)
Procedure Name: Intubation Date/Time: 03/26/2016 1:10 PM Performed by: Luciana Axe K Pre-anesthesia Checklist: Patient identified, Emergency Drugs available, Suction available and Patient being monitored Patient Re-evaluated:Patient Re-evaluated prior to inductionOxygen Delivery Method: Circle System Utilized Preoxygenation: Pre-oxygenation with 100% oxygen Intubation Type: IV induction Ventilation: Mask ventilation without difficulty Tube type: Oral Tube size: 7.5 mm Number of attempts: 1 Airway Equipment and Method: Stylet,  Oral airway and Video-laryngoscopy Placement Confirmation: ETT inserted through vocal cords under direct vision,  positive ETCO2 and breath sounds checked- equal and bilateral Secured at: 21 cm Tube secured with: Tape Dental Injury: Teeth and Oropharynx as per pre-operative assessment  Difficulty Due To: Difficult Airway- due to anterior larynx, Difficult Airway- due to limited oral opening and Difficult Airway- due to reduced neck mobility Future Recommendations: Recommend- induction with short-acting agent, and alternative techniques readily available

## 2016-03-26 NOTE — Transfer of Care (Signed)
Immediate Anesthesia Transfer of Care Note  Patient: Sarah Nguyen  Procedure(s) Performed: Procedure(s) with comments: Lumbar one-two Lumbar two -three Lumbar four-five Laminectomy with coflex (N/A) - L1-2 L2-3 L4-5 Laminectomy with coflex  Patient Location: PACU  Anesthesia Type:General  Level of Consciousness: awake, alert  and oriented  Airway & Oxygen Therapy: Patient Spontanous Breathing and Patient connected to face mask oxygen  Post-op Assessment: Report given to RN, Post -op Vital signs reviewed and stable and Patient moving all extremities X 4  Post vital signs: Reviewed and stable  Last Vitals:  Filed Vitals:   03/26/16 0914 03/26/16 1610  BP: 120/74   Pulse: 87   Temp: 36.9 C 36.4 C  Resp: 20     Complications: No apparent anesthesia complications

## 2016-03-26 NOTE — Op Note (Signed)
Date of surgery: 03/26/2016 Preoperative diagnosis: Lumbar spinal stenosis with degenerative scoliosis L1 to L2-3 and L4-5. Previous decompression L3-4. Postoperative diagnosis: Same Procedure: Bilateral laminotomies and foraminotomies L1-2 L2-3 and L4-5 decompression of the L1-L2 and L3 nerve roots in addition to decompression of the L4 and L5 nerve roots bilaterally and laterally placement of Coflex L1 to L2-3 and L4-L5 under fluoroscopic visualization. Surgeon: Kristeen Miss First assistant: Cyndy Freeze M.D. Anesthesia: Gen. endotracheal Indications: Sarah Nguyen is a 70 year old individual is had progressive difficulty with walking having increasing back pain and leg pain this is been getting progressively worse for a number of months. Sarah Nguyen has had previous decompression from disc herniation at L3-L4 years ago. She's been advised regarding the need for surgical decompression and a non-fusion stabilization with Coflex device at L1 to L2-3 and L4-5. She is now admitted for this procedure.  Procedure: The patient was brought to the operating room supine on a stretcher. After the smooth induction of general endotracheal anesthesia, she was turned prone. The back was prepped with alcohol and DuraPrep and draped in a sterile fashion. A midline incision was created in the lower lumbar spine and carried down to the lumbar dorsal fascia to expose the first spinous processes that were identified positively as L2 and L3. Then the dissection was carried out to expose L1-L2 superiorly and L4-L5 inferiorly. A subperiosteal dissection was performed in this fashion to expose those regions over the facet joints. Then I started with laminotomies at L1-L2 high-speed drill was used to remove the inferior arch and lamina out to medial wall the facet. The L ligament was taken up and laminotomy was enlarged with a 2 and 3 mm Kerrison punch. Then using a curved Kerrison the laminotomy was undercut superiorly to expose  decompress the L1 nerve roots inferiorly straight Kerrison was used to decompress the L2 nerve roots. Attention was then turned to L2-3 were similar laminotomies were carried out and superiorly we decompress the L2 nerve roots inferiorly the L3 nerve roots. Interspinous ligament was taken down and the interspace was then sized for an appropriately sized Coflex device and was felt that an 8 mm Coflex would fit best at L1-L2 and also at L2-L3 Coflex was then placed and seated.  Attention was then turned to L4-L5 were bilateral laminotomies were carried out in a similar fashion with a curved 2 mm and 3 mm Kerrison punch being is to decompress the path of the L IV nerve root superiorly and the L5 nerve root inferiorly. Same series of curved curettes were then used to decompress the channel out even further once the decompression was completed the interspinous ligament was taken down in the interspace was sized for appropriate size Coflex and this case it was also 8 mm size. Once the Coflex was put into position the wings were bent to fit around the spinous process and final radiographs were obtained in AP and lateral projection. Lumbar dorsal fascia was then reapproximated with #1 Vicryl interrupted fashion 2-0 Vicryl was used in the subcutaneous tissues and 3-0 Vicryl subcuticularly. Dermabond was placed on the skin blood loss was estimated at 150 mL. 20 mL of half percent Marcaine was injected into the paraspinous fascia.

## 2016-03-26 NOTE — H&P (Signed)
CHIEF COMPLAINT:                                          Chronic severe worsening back pain.  HISTORY OF PRESENT ILLNESS:                     Sarah Nguyen is a 70 year old right-handed individual whom I had seen and treated in the early 90s.  Back then she had limited decompression for some lumbar disorder and she did reasonably well.  She has had worsening back pain with now weakness in her legs that has caused her to have some falls on a couple of occasions.  She finds that her left leg tends to give way.  In addition she has severe pain around the region of the beltline and down below in to her lower extremities.  She finds that standing for a while tends to cause a tightness sensation and some numbness with a band like sensation in her lower extremities.  She finds that the pain is getting worse despite her best efforts to treat it conservatively with some over-the-counter medications.  She occasionally finds that she cannot get comfortable and sleeping is increasingly difficult.  She has not had any radiographs, so today in the office we obtained a lateral flexion, extension film plus a singular AP view.  The radiographs demonstrate that Sarah Nguyen has advancing degenerative changes at L3-4 being the singular worse level but also at L4-5 and to a lesser extent at L2-3.  On the AP view there is evidence of coronal imbalance with degenerative scoliosis that is forming at the level of L3-4.  She has loss of normal lordosis in the lumbar spine.  Motion between flexion, extension, however, is still fairly well preserved.  REVIEW OF SYSTEMS:                                    Notable for some night sweats, wearing glasses, cataracts, hearing loss, balance disturbance, some sinus problems and sinus headache, high blood pressure, high cholesterol, swelling in the feet and hands, some shortness of breath, joint pain and swelling, arthritis, back pain, leg pain, nausea, difficulty with memory, inability to concentrate, double  and blurred vision, depression and diabetes, all noted on a 14-point review sheet.  PAST MEDICAL HISTORY:                                Significant for hypertension and diabetes.  She also has had some significant bouts of arthritis.  She notes that in 1975 she had a vaginal hysterectomy because of a high grade Pap smear but she has not had any other cancer diagnosis ever.  SOCIAL HISTORY:                                            Her personal health reveals that her general health has otherwise been intact.  She does not smoke or use alcohol.  She has had some 15-pound weight gain since March of this year, currently at height 5 feet 8 inches and 228 pounds.  PHYSICAL EXAMINATION:  Currently reveals that she stands straight and erect without difficulty.  She has good overall strength in the proximal musculature including the iliopsoas and the quads bilaterally.  Her gastrocs on the left side suggests some weakness at 4/5.  She has absent reflexes in both patellae and both Achilles.  Sensation is intact to vibration distally.  Straight leg raising appears positive from a seated position at about 15 degrees.  Palpation and percussion of her back does not reproduce any pain.  There is no palpable or visual deformity that I can see consistent with the radiographic findings.   A new MRI demonstrates that  in addition to the scoliotic curvature in her back, stenosis at L2-3 and at L4-5. She has moderately severe stenosis also at L1-L2. L1-L2  is due to a combination of disc herniation and facet hypertrophy. L2-L3 is due to a similar combination. L4-5 is due also to disc herniation and posterior element hypertrophy and facet arthropathy.   IMPRESSION/PLAN:                             I indicated to Sarah Nguyen that ultimately she will need surgical decompression of each of these levels. I believe that Sarah Nguyen would benefit from having placement of a Coflex at L1-2, 2-3, and 4-5 and  avoid surgical arthrodesis. This would lessen the severity of the surgery and should give her good relief of the worst of her claudication type symptoms in addition to the radiculopathy. This is a significant surgery. Sarah Nguyen told me that recently she was told that she had some cardiac abnormality and is to have a cardiac evaluation. I certainly want her to have this before we contemplate proceeding with the surgery. Sarah Nguyen also would like to be seen by one of the [facility] based cariology groups, mainly [facility]. We will see what we can do to help facilitate her evaluation there.

## 2016-03-27 ENCOUNTER — Encounter (HOSPITAL_COMMUNITY): Payer: Self-pay | Admitting: Neurological Surgery

## 2016-03-27 DIAGNOSIS — R269 Unspecified abnormalities of gait and mobility: Secondary | ICD-10-CM | POA: Diagnosis not present

## 2016-03-27 LAB — GLUCOSE, CAPILLARY
GLUCOSE-CAPILLARY: 136 mg/dL — AB (ref 65–99)
GLUCOSE-CAPILLARY: 82 mg/dL (ref 65–99)
Glucose-Capillary: 107 mg/dL — ABNORMAL HIGH (ref 65–99)
Glucose-Capillary: 90 mg/dL (ref 65–99)

## 2016-03-27 NOTE — Evaluation (Signed)
Physical Therapy Evaluation Patient Details Name: Sarah Nguyen MRN: [id number] DOB: November 11, 1946 Today's Date: 03/27/2016   History of Present Illness  Pt is a 70 y/o female who presents s/p L1-L5 laminectomy on 03/26/16.  Clinical Impression  Pt admitted with above diagnosis. Pt currently with functional limitations due to the deficits listed below (see PT Problem List). At the time of PT eval pt was able to perform transfers and ambulation with min guard assist grossly. Pt will benefit from skilled PT to increase their independence and safety with mobility to allow discharge to the venue listed below.       Follow Up Recommendations Outpatient PT;Supervision for mobility/OOB    Equipment Recommendations  Rolling walker with 5" wheels;3in1 (PT)    Recommendations for Other Services       Precautions / Restrictions Precautions Precautions: Fall;Back Precaution Booklet Issued: Yes (comment) Precaution Comments: Reviewed handout with pt. Pt was cued for precautions during functional mobility.  Required Braces or Orthoses: Spinal Brace Spinal Brace: Lumbar corset;Applied in sitting position Restrictions Weight Bearing Restrictions: No      Mobility  Bed Mobility Overal bed mobility: Needs Assistance Bed Mobility: Rolling;Sidelying to Sit Rolling: Modified independent (Device/Increase time) Sidelying to sit: Min guard       General bed mobility comments: HOB flat and rails lowered to simulate home environment. Pt was able to transition to full sitting position with close guard for trunk support.   Transfers Overall transfer level: Needs assistance Equipment used: Rolling walker (2 wheeled) Transfers: Sit to/from Stand Sit to Stand: Min guard         General transfer comment: Hands-on assist required for safe power-up to full standing position. Increased time required prior to initiating gait training due to dizziness.   Ambulation/Gait Ambulation/Gait assistance: Min  guard Ambulation Distance (Feet): 400 Feet Assistive device: Rolling walker (2 wheeled) Gait Pattern/deviations: Step-through pattern;Decreased stride length;Trunk flexed Gait velocity: Decreased Gait velocity interpretation: Below normal speed for age/gender General Gait Details: VC's for improved posture and walker placement close to pt's body. Hands-on guarding required initially with progression to close, hands-off guarding towards end of gait training.   Stairs            Wheelchair Mobility    Modified Rankin (Stroke Patients Only)       Balance Overall balance assessment: Needs assistance Sitting-balance support: Feet supported;No upper extremity supported Sitting balance-Leahy Scale: Fair     Standing balance support: Bilateral upper extremity supported;During functional activity Standing balance-Leahy Scale: Poor Standing balance comment: Pt requires UE support for safe dynamic balance. Per nursing, pt with retropulsion in standing without UE support.                              Pertinent Vitals/Pain Pain Assessment: Faces Faces Pain Scale: Hurts even more Pain Location: Incision site Pain Descriptors / Indicators: Operative site guarding;Sore Pain Intervention(s): Limited activity within patient's tolerance;Monitored during session;Repositioned    Home Living Family/patient expects to be discharged to:: Private residence Living Arrangements: Alone Available Help at Discharge: Family;Available 24 hours/day Type of Home: House Home Access: Level entry     Home Layout: One level Home Equipment: None      Prior Function Level of Independence: Independent               Hand Dominance        Extremity/Trunk Assessment   Upper Extremity Assessment: Defer to OT evaluation  Lower Extremity Assessment: Generalized weakness      Cervical / Trunk Assessment: Other exceptions  Communication   Communication: No  difficulties  Cognition Arousal/Alertness: Awake/alert Behavior During Therapy: WFL for tasks assessed/performed Overall Cognitive Status: Within Functional Limits for tasks assessed                      General Comments      Exercises        Assessment/Plan    PT Assessment Patient needs continued PT services  PT Diagnosis Difficulty walking;Acute pain   PT Problem List Decreased strength;Decreased range of motion;Decreased activity tolerance;Decreased balance;Decreased mobility;Decreased knowledge of use of DME;Decreased safety awareness;Decreased knowledge of precautions;Pain  PT Treatment Interventions DME instruction;Gait training;Stair training;Functional mobility training;Therapeutic activities;Therapeutic exercise;Neuromuscular re-education;Patient/family education   PT Goals (Current goals can be found in the Care Plan section) Acute Rehab PT Goals Patient Stated Goal: Home tomorrow PT Goal Formulation: With patient Time For Goal Achievement: 04/03/16 Potential to Achieve Goals: Good    Frequency Min 5X/week   Barriers to discharge Decreased caregiver support Pt lives alone but on a property with many other family members. Pt states she will have family support but is somewhat vague as well.     Co-evaluation               End of Session Equipment Utilized During Treatment: Gait belt;Back brace Activity Tolerance: Patient tolerated treatment well Patient left: in chair;with call bell/phone within reach Nurse Communication: Mobility status         Time: 0731-0752 PT Time Calculation (min) (ACUTE ONLY): 21 min   Charges:   PT Evaluation $PT Eval Moderate Complexity: 1 Procedure     PT G Codes:        Rolinda Roan 04/01/2016, 9:11 AM  Rolinda Roan, PT, DPT Acute Rehabilitation Services Pager: [phone]

## 2016-03-27 NOTE — Anesthesia Postprocedure Evaluation (Signed)
Anesthesia Post Note  Patient: Sarah Nguyen  Procedure(s) Performed: Procedure(s) (LRB): Lumbar one-two Lumbar two -three Lumbar four-five Laminectomy with coflex (N/A)  Patient location during evaluation: PACU Anesthesia Type: General Level of consciousness: awake and alert Pain management: pain level controlled Vital Signs Assessment: post-procedure vital signs reviewed and stable Respiratory status: spontaneous breathing, nonlabored ventilation, respiratory function stable and patient connected to nasal cannula oxygen Cardiovascular status: blood pressure returned to baseline and stable Postop Assessment: no signs of nausea or vomiting Anesthetic complications: no    Last Vitals:  Filed Vitals:   03/27/16 0814 03/27/16 1622  BP: 75/43 108/50  Pulse: 79 79  Temp: 37 C 36.5 C  Resp: 18 18    Last Pain:  Filed Vitals:   03/27/16 1623  PainSc: Asleep                 Astor Gentle,JAMES TERRILL

## 2016-03-27 NOTE — Evaluation (Signed)
Occupational Therapy Evaluation Patient Details Name: Sarah Nguyen MRN: [id number] DOB: [birth year] Today's Date: 03/27/2016    History of Present Illness Pt is a 70 y/o female who presents s/p L1-L5 laminectomy on 03/26/16.   Clinical Impression   Pt reports she was independent with ADLs PTA. Currently pt overall min guard for ADLs and functional mobility with the exception of mod assist for LB ADLs. Began safety, ADL, and back education with pt. Pt planning to d/c home with 24/7 supervision from family. Pt would benefit from continued skilled OT to address established goals.    Follow Up Recommendations  No OT follow up;Supervision - Intermittent    Equipment Recommendations  3 in 1 bedside comode;Other (comment) (AE?)    Recommendations for Other Services       Precautions / Restrictions Precautions Precautions: Fall;Back Precaution Booklet Issued: Yes (comment) Precaution Comments: Reviewed all precautions with pt. Required Braces or Orthoses: Spinal Brace Spinal Brace: Lumbar corset;Applied in sitting position Restrictions Weight Bearing Restrictions: No      Mobility Bed Mobility Overal bed mobility: Needs Assistance Bed Mobility: Rolling;Sidelying to Sit Rolling: Modified independent (Device/Increase time) Sidelying to sit: Min guard       General bed mobility comments: Pt OOB in chiar upon arrival.  Transfers Overall transfer level: Needs assistance Equipment used: Rolling walker (2 wheeled) Transfers: Sit to/from Stand Sit to Stand: Min guard         General transfer comment: Min guard for safety with sit to stand from chair x 1. VCs for hand placement and technique.    Balance Overall balance assessment: Needs assistance Sitting-balance support: Feet supported;No upper extremity supported Sitting balance-Leahy Scale: Fair     Standing balance support: No upper extremity supported;During functional activity Standing balance-Leahy Scale:  Fair Standing balance comment: Pt requires UE support for safe dynamic balance. Per nursing, pt with retropulsion in standing without UE support.                             ADL Overall ADL's : Needs assistance/impaired Eating/Feeding: Set up;Sitting   Grooming: Min guard;Standing Grooming Details (indicate cue type and reason): Educated on use of 2 cups for oral care Upper Body Bathing: Supervision/ safety;Sitting   Lower Body Bathing: Moderate assistance;Sit to/from stand   Upper Body Dressing : Supervision/safety;Sitting   Lower Body Dressing: Moderate assistance;Sit to/from stand   Toilet Transfer: Min guard;Ambulation;BSC;RW   Toileting- Clothing Manipulation and Hygiene: Minimal assistance;Sit to/from stand       Functional mobility during ADLs: Min guard;Rolling walker General ADL Comments: Educated pt on maintaining precautions during functional activities, not sitting up in chair for longer than 60min-1 hour at a time.     Vision     Perception     Praxis      Pertinent Vitals/Pain Pain Assessment: Faces Faces Pain Scale: Hurts even more Pain Location: back Pain Descriptors / Indicators: Operative site guarding;Aching Pain Intervention(s): Limited activity within patient's tolerance;Monitored during session;Repositioned     Hand Dominance     Extremity/Trunk Assessment Upper Extremity Assessment Upper Extremity Assessment: Overall WFL for tasks assessed   Lower Extremity Assessment Lower Extremity Assessment: Defer to PT evaluation   Cervical / Trunk Assessment Cervical / Trunk Assessment: Other exceptions Cervical / Trunk Exceptions: Acute lumbar incision   Communication Communication Communication: No difficulties   Cognition Arousal/Alertness: Awake/alert Behavior During Therapy: WFL for tasks assessed/performed Overall Cognitive Status: Within Functional Limits for tasks  assessed                     General Comments        Exercises       Shoulder Instructions      Home Living Family/patient expects to be discharged to:: Private residence Living Arrangements: Alone Available Help at Discharge: Family;Available 24 hours/day Type of Home: House Home Access: Level entry     Home Layout: One level     Bathroom Shower/Tub: Tub/shower unit Shower/tub characteristics: Architectural technologist: Handicapped height Bathroom Accessibility: Yes How Accessible: Accessible via walker Home Equipment: None          Prior Functioning/Environment Level of Independence: Independent             OT Diagnosis: Generalized weakness;Acute pain   OT Problem List: Impaired balance (sitting and/or standing);Decreased safety awareness;Decreased knowledge of use of DME or AE;Decreased knowledge of precautions;Pain;Obesity   OT Treatment/Interventions: Self-care/ADL training;Energy conservation;DME and/or AE instruction;Patient/family education;Balance training    OT Goals(Current goals can be found in the care plan section) Acute Rehab OT Goals Patient Stated Goal: Home tomorrow OT Goal Formulation: With patient Time For Goal Achievement: 04/10/16 Potential to Achieve Goals: Good ADL Goals Pt Will Perform Lower Body Bathing: with modified independence;sit to/from stand (with or without AE) Pt Will Perform Lower Body Dressing: with modified independence;sit to/from stand (with or without AE) Pt Will Transfer to Toilet: with modified independence;ambulating;bedside commode (over toilet) Pt Will Perform Toileting - Clothing Manipulation and hygiene: with modified independence;sit to/from stand (with or without AE) Pt Will Perform Tub/Shower Transfer: Tub transfer;with modified independence;ambulating;3 in 1;rolling walker Additional ADL Goal #1: Pt will independently verbally recall 3/3 back precautions and maintain throughout ADL. Additional ADL Goal #2: Pt will independently don/doff back brace as precursor  for ADLs and functional mobility.  OT Frequency: Min 2X/week   Barriers to D/C: Decreased caregiver support  pt lives alone       Co-evaluation              End of Session Equipment Utilized During Treatment: Gait belt;Rolling walker;Back brace Nurse Communication: Other (comment) (needs 3 in 1)  Activity Tolerance: Patient limited by pain Patient left: in chair;with call bell/phone within reach   Time: [id number] OT Time Calculation (min): 12 min Charges:  OT General Charges $OT Visit: 1 Procedure OT Evaluation $OT Eval Moderate Complexity: 1 Procedure G-Codes:     Binnie Kand M.S., OTR/L Pager: [phone]  03/27/2016, 12:54 PM

## 2016-03-27 NOTE — Progress Notes (Signed)
Patient ID: Sarah Nguyen, female   DOB: [birth year], 70 y.o.   MRN: [id number] Vital signs are stable Motor function is intact Patient having some moderate back soreness Will observe in hospital today Dressing remains dry

## 2016-03-28 LAB — GLUCOSE, CAPILLARY
Glucose-Capillary: 112 mg/dL — ABNORMAL HIGH (ref 65–99)
Glucose-Capillary: 86 mg/dL (ref 65–99)

## 2016-03-28 MED ORDER — OXYCODONE-ACETAMINOPHEN 5-325 MG PO TABS: 1.0000 | ORAL_TABLET | ORAL | Status: DC | PRN

## 2016-03-28 MED ORDER — DIAZEPAM 5 MG PO TABS: 5.0000 mg | ORAL_TABLET | Freq: Four times a day (QID) | ORAL | Status: DC | PRN

## 2016-03-28 NOTE — Progress Notes (Signed)
Occupational Therapy Treatment/Discharge Patient Details Name: ELENER CUSTODIO MRN: [id number] DOB: 03-Jan-1946 Today's Date: 03/28/2016    History of present illness Pt is a 70 y/o female who presents s/p L1-L5 laminectomy on 03/26/16.   OT comments  Pt progressing well and has achieved all acute OT goals. Pt completed LB ADLs with supervision using compensatory strategies and supervision for functional transfers. Reviewed back precautions, brace wear protocol, compensatory strategies, and fall prevention strategies. All education has been completed and pt has no further questions. OT signing off.   Follow Up Recommendations  No OT follow up;Supervision - Intermittent    Equipment Recommendations  3 in 1 bedside comode    Recommendations for Other Services      Precautions / Restrictions Precautions Precautions: Fall;Back Precaution Booklet Issued: No Precaution Comments: Pt able to recall 1/3 precautions. Reviewed handout. Pt able to recall 3/3 precautions at end of session. Required Braces or Orthoses: Spinal Brace Spinal Brace: Lumbar corset;Applied in sitting position Restrictions Weight Bearing Restrictions: No       Mobility Bed Mobility Overal bed mobility: Modified Independent Bed Mobility: Rolling;Sidelying to Sit;Sit to Sidelying           General bed mobility comments: HOB flat, use of bedrails. Good demonstration of log roll technique  Transfers Overall transfer level: Needs assistance Equipment used: Rolling walker (2 wheeled) Transfers: Sit to/from Stand Sit to Stand: Supervision         General transfer comment: Supervision for safety. Verbal cues for safe hand placement and for proper use of RW - pt attempting to leave RW behind.    Balance Overall balance assessment: Needs assistance Sitting-balance support: No upper extremity supported;Feet supported Sitting balance-Leahy Scale: Good     Standing balance support: No upper extremity  supported;During functional activity Standing balance-Leahy Scale: Fair Standing balance comment: Able to maintain static standing balance without UE support                   ADL Overall ADL's : Needs assistance/impaired         Upper Body Bathing: Supervision/ safety;Sitting   Lower Body Bathing: Supervison/ safety;Sit to/from stand Lower Body Bathing Details (indicate cue type and reason): educated on use of long-handled sponge, able to cross ankle-over-knee Upper Body Dressing : Supervision/safety;Sitting   Lower Body Dressing: Supervision/safety;Cueing for compensatory techniques;Sit to/from stand Lower Body Dressing Details (indicate cue type and reason): able to cross ankle-over-knee Toilet Transfer: Supervision/safety;Ambulation;BSC;RW   Toileting- Clothing Manipulation and Hygiene: Supervision/safety;Sit to/from stand   Tub/ Shower Transfer: Tub transfer;Min guard;Cueing for safety;Ambulation;3 in 1;Rolling walker   Functional mobility during ADLs: Supervision/safety;Rolling walker General ADL Comments: Reviewed back precautions, brace wear protocol, and compensatory strategies for ADLs. No family present for session.      Vision                     Perception     Praxis      Cognition   Behavior During Therapy: [facility] for tasks assessed/performed Overall Cognitive Status: Within Functional Limits for tasks assessed       Memory: Decreased recall of precautions               Extremity/Trunk Assessment               Exercises     Shoulder Instructions       General Comments      Pertinent Vitals/ Pain       Pain Assessment: 0-10  Pain Score: 3  Pain Location: back Pain Descriptors / Indicators: Sore Pain Intervention(s): Limited activity within patient's tolerance;Monitored during session;Repositioned  Home Living                                          Prior Functioning/Environment               Frequency       Progress Toward Goals  OT Goals(current goals can now be found in the care plan section)  Progress towards OT goals: Goals met/education completed, patient discharged from OT  Acute Rehab OT Goals Patient Stated Goal: home today OT Goal Formulation: With patient Time For Goal Achievement: 04/10/16 Potential to Achieve Goals: Good ADL Goals Pt Will Perform Lower Body Bathing: with modified independence;sit to/from stand Pt Will Perform Lower Body Dressing: with modified independence;sit to/from stand Pt Will Transfer to Toilet: with modified independence;ambulating;bedside commode (over toilet) Pt Will Perform Toileting - Clothing Manipulation and hygiene: with modified independence;sit to/from stand Pt Will Perform Tub/Shower Transfer: Tub transfer;with modified independence;ambulating;3 in 1;rolling walker Additional ADL Goal #1: Pt will independently verbally recall 3/3 back precautions and maintain throughout ADL. Additional ADL Goal #2: Pt will independently don/doff back brace as precursor for ADLs and functional mobility.  Plan All goals met and education completed, patient discharged from OT services    Co-evaluation                 End of Session Equipment Utilized During Treatment: Gait belt;Rolling walker;Back brace   Activity Tolerance Patient tolerated treatment well   Patient Left in bed;with call bell/phone within reach   Nurse Communication Mobility status        Time: 1595-3967 OT Time Calculation (min): 23 min  Charges: OT General Charges $OT Visit: 1 Procedure OT Treatments $Self Care/Home Management : 23-37 mins  Redmond Baseman, OTR/L Pager: [phone] 03/28/2016, 12:49 PM

## 2016-03-28 NOTE — Progress Notes (Signed)
Physical Therapy Treatment Patient Details Name: Sarah Nguyen MRN: [id number] DOB: 28-Dec-1945 Today's Date: 03/28/2016    History of Present Illness Pt is a 70 y/o female who presents s/p L1-L5 laminectomy on 03/26/16.    PT Comments    Pt progressing towards physical therapy goals. Pt is aware that she is unsteady at times and reports that staff has cautioned her for taking steps backwards to steady herself when she is tired. Pt required close supervision during session but no real hands-on assistance was necessary. Pt maintains that she will have adequate support at home from family that live in the same yard as her, however it appears that she will be by herself at times. Recommended continued use of RW, BSC, and to arrange for family to be present during her waking hours. Will continue to follow.   Follow Up Recommendations  Outpatient PT;Supervision for mobility/OOB     Equipment Recommendations  Rolling walker with 5" wheels;3in1 (PT)    Recommendations for Other Services       Precautions / Restrictions Precautions Precautions: Fall;Back Precaution Booklet Issued: Yes (comment) Precaution Comments: Reviewed handout with pt. Pt was cued for precautions during functional mobility.  Required Braces or Orthoses: Spinal Brace Spinal Brace: Lumbar corset;Applied in sitting position Restrictions Weight Bearing Restrictions: No    Mobility  Bed Mobility Overal bed mobility: Needs Assistance Bed Mobility: Rolling;Sidelying to Sit Rolling: Modified independent (Device/Increase time) Sidelying to sit: Min guard       General bed mobility comments: HOB flat and rails lowered to simulate home environment. Pt was able to transition to full sitting position with close guard for trunk support.   Transfers Overall transfer level: Needs assistance Equipment used: Rolling walker (2 wheeled) Transfers: Sit to/from Stand Sit to Stand: Supervision         General transfer  comment: Close supervision required for safe power-up to full standing position.  Ambulation/Gait Ambulation/Gait assistance: Supervision Ambulation Distance (Feet): 400 Feet Assistive device: Rolling walker (2 wheeled) Gait Pattern/deviations: Step-through pattern;Decreased stride length;Trunk flexed Gait velocity: Decreased Gait velocity interpretation: Below normal speed for age/gender General Gait Details: VC's for improved posture and walker placement close to pt's body. Close supervision for safety but no hands-on assist was required. Pt reports she has been retropulsing with staff but did not observe during session.    Stairs            Wheelchair Mobility    Modified Rankin (Stroke Patients Only)       Balance Overall balance assessment: Needs assistance Sitting-balance support: Feet supported;No upper extremity supported Sitting balance-Leahy Scale: Fair     Standing balance support: No upper extremity supported;During functional activity Standing balance-Leahy Scale: Fair Standing balance comment: Able to maintain static standing balance without UE support                    Cognition Arousal/Alertness: Awake/alert Behavior During Therapy: WFL for tasks assessed/performed Overall Cognitive Status: Within Functional Limits for tasks assessed       Memory: Decreased recall of precautions              Exercises      General Comments        Pertinent Vitals/Pain Pain Assessment: Faces Pain Score: 3  Faces Pain Scale: Hurts a little bit Pain Location: back Pain Descriptors / Indicators: Operative site guarding;Sore Pain Intervention(s): Limited activity within patient's tolerance;Monitored during session;Repositioned    Home Living  Prior Function            PT Goals (current goals can now be found in the care plan section) Acute Rehab PT Goals Patient Stated Goal: Home today PT Goal Formulation: With  patient Time For Goal Achievement: 04/03/16 Potential to Achieve Goals: Good Progress towards PT goals: Progressing toward goals    Frequency  Min 5X/week    PT Plan Current plan remains appropriate    Co-evaluation             End of Session Equipment Utilized During Treatment: Gait belt;Back brace Activity Tolerance: Patient tolerated treatment well Patient left: in chair;with call bell/phone within reach     Time: [id number] PT Time Calculation (min) (ACUTE ONLY): 12 min  Charges:  $Gait Training: 8-22 mins                    G Codes:      Rolinda Roan 2016/04/16, 1:46 PM  Rolinda Roan, PT, DPT Acute Rehabilitation Services Pager: [phone]

## 2016-03-28 NOTE — Progress Notes (Signed)
Patient alert and oriented, mae's well, voiding adequate amount of urine, swallowing without difficulty, no c/o pain. Patient discharged home with family. Script and discharged instructions given to patient. Patient and family stated understanding of d/c instructions given and has an appointment with MD. 

## 2016-03-29 NOTE — Discharge Summary (Signed)
Physician Discharge Summary  Patient ID: Sarah Nguyen MRN: [id number] DOB/AGE: [birth year] 68 y.o.  Admit date: 03/26/2016 Discharge date: 03/28/2016  Admission Diagnoses: Lumbar spinal stenosis L1-2 L2-3 and L4-5 with neurogenic claudication, lumbar radiculopathy  Discharge Diagnoses: Lumbar spinal stenosis L1-2 and L2-3 and L4-5 with neurogenic claudication and lumbar radiculopathy Active Problems:   Lumbar stenosis with neurogenic claudication   Discharged Condition: good  Hospital Course: Patient was mitigated undergo surgical decompression arthrodesis which she tolerated well and was discharged home she still had some residual radicular pain in her buttock and hip  Consults: None  Significant Diagnostic Studies: None  Treatments: surgery: Bilateral laminotomies and foraminotomies L1 to L2-3 and L4-5 placement of Coflex device L1 to L2-3 and L4-5  Discharge Exam: Blood pressure 111/53, pulse 81, temperature 98.6 F (37 C), temperature source Oral, resp. rate 18, weight 105.688 kg (233 lb), SpO2 96 %. Incision is clean and dry, motor function is intact in lower extremities station and gait are normal.  Disposition: 01-Home or Self Care  Discharge Instructions    Call MD for:  redness, tenderness, or signs of infection (pain, swelling, redness, odor or green/yellow discharge around incision site)    Complete by:  As directed      Call MD for:  severe uncontrolled pain    Complete by:  As directed      Call MD for:  temperature >100.4    Complete by:  As directed      Diet - low sodium heart healthy    Complete by:  As directed      Discharge instructions    Complete by:  As directed   Okay to shower. Do not apply salves or appointments to incision. No heavy lifting with the upper extremities greater than 15 pounds. May resume driving when not requiring pain medication and patient feels comfortable with doing so.     Increase activity slowly    Complete by:  As directed              Medication List    TAKE these medications        atorvastatin 20 MG tablet  Commonly known as:  LIPITOR  Take 20 mg by mouth at bedtime.     diazepam 5 MG tablet  Commonly known as:  VALIUM  Take 1 tablet (5 mg total) by mouth every 6 (six) hours as needed for muscle spasms.     loratadine 10 MG tablet  Commonly known as:  CLARITIN  Take 10 mg by mouth at bedtime.     losartan-hydrochlorothiazide 50-12.5 MG tablet  Commonly known as:  HYZAAR  Take 1 tablet by mouth at bedtime.     metFORMIN 500 MG 24 hr tablet  Commonly known as:  GLUCOPHAGE-XR  Take 1,000 mg by mouth at bedtime.     metoprolol succinate 25 MG 24 hr tablet  Commonly known as:  TOPROL-XL  Take 25 mg by mouth at bedtime.     omeprazole 20 MG capsule  Commonly known as:  PRILOSEC  Take 20 mg by mouth at bedtime.     oxyCODONE-acetaminophen 5-325 MG tablet  Commonly known as:  PERCOCET/ROXICET  Take 1-2 tablets by mouth every 4 (four) hours as needed for moderate pain.           Follow-up Information    Follow up with Earleen Newport, MD.   Specialty:  Neurosurgery   Contact information:   [address]  [id number] [phone]       Signed: Earleen Newport 03/29/2016, 12:27 PM

## 2016-04-17 DIAGNOSIS — M4806 Spinal stenosis, lumbar region: Secondary | ICD-10-CM | POA: Diagnosis not present

## 2016-05-09 DIAGNOSIS — E7800 Pure hypercholesterolemia, unspecified: Secondary | ICD-10-CM | POA: Diagnosis not present

## 2016-05-09 DIAGNOSIS — I10 Essential (primary) hypertension: Secondary | ICD-10-CM | POA: Diagnosis not present

## 2016-05-09 DIAGNOSIS — N189 Chronic kidney disease, unspecified: Secondary | ICD-10-CM | POA: Diagnosis not present

## 2016-05-09 DIAGNOSIS — E118 Type 2 diabetes mellitus with unspecified complications: Secondary | ICD-10-CM | POA: Diagnosis not present

## 2016-05-09 DIAGNOSIS — Z13220 Encounter for screening for lipoid disorders: Secondary | ICD-10-CM | POA: Diagnosis not present

## 2016-05-21 DIAGNOSIS — F321 Major depressive disorder, single episode, moderate: Secondary | ICD-10-CM | POA: Diagnosis not present

## 2016-05-21 DIAGNOSIS — E119 Type 2 diabetes mellitus without complications: Secondary | ICD-10-CM | POA: Diagnosis not present

## 2016-05-21 DIAGNOSIS — Z1389 Encounter for screening for other disorder: Secondary | ICD-10-CM | POA: Diagnosis not present

## 2016-05-21 DIAGNOSIS — E7800 Pure hypercholesterolemia, unspecified: Secondary | ICD-10-CM | POA: Diagnosis not present

## 2016-05-21 DIAGNOSIS — E118 Type 2 diabetes mellitus with unspecified complications: Secondary | ICD-10-CM | POA: Diagnosis not present

## 2016-05-21 DIAGNOSIS — N189 Chronic kidney disease, unspecified: Secondary | ICD-10-CM | POA: Diagnosis not present

## 2016-05-21 DIAGNOSIS — I10 Essential (primary) hypertension: Secondary | ICD-10-CM | POA: Diagnosis not present

## 2016-05-21 DIAGNOSIS — Z1212 Encounter for screening for malignant neoplasm of rectum: Secondary | ICD-10-CM | POA: Diagnosis not present

## 2016-08-28 DIAGNOSIS — M4156 Other secondary scoliosis, lumbar region: Secondary | ICD-10-CM | POA: Diagnosis not present

## 2016-08-28 DIAGNOSIS — M4806 Spinal stenosis, lumbar region: Secondary | ICD-10-CM | POA: Diagnosis not present

## 2016-08-28 DIAGNOSIS — Z6834 Body mass index (BMI) 34.0-34.9, adult: Secondary | ICD-10-CM | POA: Diagnosis not present

## 2016-09-02 DIAGNOSIS — M5136 Other intervertebral disc degeneration, lumbar region: Secondary | ICD-10-CM | POA: Diagnosis not present

## 2016-09-02 DIAGNOSIS — M4726 Other spondylosis with radiculopathy, lumbar region: Secondary | ICD-10-CM | POA: Diagnosis not present

## 2016-09-02 DIAGNOSIS — M5416 Radiculopathy, lumbar region: Secondary | ICD-10-CM | POA: Diagnosis not present

## 2016-09-02 DIAGNOSIS — M4806 Spinal stenosis, lumbar region: Secondary | ICD-10-CM | POA: Diagnosis not present

## 2016-09-09 DIAGNOSIS — I10 Essential (primary) hypertension: Secondary | ICD-10-CM | POA: Diagnosis not present

## 2016-09-09 DIAGNOSIS — E782 Mixed hyperlipidemia: Secondary | ICD-10-CM | POA: Diagnosis not present

## 2016-09-09 DIAGNOSIS — N189 Chronic kidney disease, unspecified: Secondary | ICD-10-CM | POA: Diagnosis not present

## 2016-09-09 DIAGNOSIS — E118 Type 2 diabetes mellitus with unspecified complications: Secondary | ICD-10-CM | POA: Diagnosis not present

## 2016-09-09 DIAGNOSIS — E7800 Pure hypercholesterolemia, unspecified: Secondary | ICD-10-CM | POA: Diagnosis not present

## 2016-09-17 DIAGNOSIS — Z23 Encounter for immunization: Secondary | ICD-10-CM | POA: Diagnosis not present

## 2016-09-17 DIAGNOSIS — Z1212 Encounter for screening for malignant neoplasm of rectum: Secondary | ICD-10-CM | POA: Diagnosis not present

## 2016-09-17 DIAGNOSIS — I10 Essential (primary) hypertension: Secondary | ICD-10-CM | POA: Diagnosis not present

## 2016-09-17 DIAGNOSIS — Z6834 Body mass index (BMI) 34.0-34.9, adult: Secondary | ICD-10-CM | POA: Diagnosis not present

## 2016-09-17 DIAGNOSIS — E119 Type 2 diabetes mellitus without complications: Secondary | ICD-10-CM | POA: Diagnosis not present

## 2016-09-17 DIAGNOSIS — N189 Chronic kidney disease, unspecified: Secondary | ICD-10-CM | POA: Diagnosis not present

## 2016-09-17 DIAGNOSIS — Z6836 Body mass index (BMI) 36.0-36.9, adult: Secondary | ICD-10-CM | POA: Diagnosis not present

## 2016-09-19 ENCOUNTER — Other Ambulatory Visit (HOSPITAL_COMMUNITY): Payer: Self-pay | Admitting: General Practice

## 2016-09-19 DIAGNOSIS — M810 Age-related osteoporosis without current pathological fracture: Secondary | ICD-10-CM

## 2016-09-19 DIAGNOSIS — Z1231 Encounter for screening mammogram for malignant neoplasm of breast: Secondary | ICD-10-CM

## 2016-09-23 ENCOUNTER — Other Ambulatory Visit (HOSPITAL_COMMUNITY): Payer: PPO

## 2016-09-23 ENCOUNTER — Ambulatory Visit (HOSPITAL_COMMUNITY): Payer: PPO

## 2016-10-23 DIAGNOSIS — M4156 Other secondary scoliosis, lumbar region: Secondary | ICD-10-CM | POA: Diagnosis not present

## 2016-12-19 DIAGNOSIS — I10 Essential (primary) hypertension: Secondary | ICD-10-CM | POA: Diagnosis not present

## 2016-12-19 DIAGNOSIS — E118 Type 2 diabetes mellitus with unspecified complications: Secondary | ICD-10-CM | POA: Diagnosis not present

## 2016-12-19 DIAGNOSIS — E782 Mixed hyperlipidemia: Secondary | ICD-10-CM | POA: Diagnosis not present

## 2016-12-19 DIAGNOSIS — E7800 Pure hypercholesterolemia, unspecified: Secondary | ICD-10-CM | POA: Diagnosis not present

## 2016-12-24 DIAGNOSIS — M2550 Pain in unspecified joint: Secondary | ICD-10-CM | POA: Diagnosis not present

## 2016-12-24 DIAGNOSIS — E119 Type 2 diabetes mellitus without complications: Secondary | ICD-10-CM | POA: Diagnosis not present

## 2016-12-24 DIAGNOSIS — F321 Major depressive disorder, single episode, moderate: Secondary | ICD-10-CM | POA: Diagnosis not present

## 2016-12-24 DIAGNOSIS — N189 Chronic kidney disease, unspecified: Secondary | ICD-10-CM | POA: Diagnosis not present

## 2016-12-24 DIAGNOSIS — I10 Essential (primary) hypertension: Secondary | ICD-10-CM | POA: Diagnosis not present

## 2016-12-24 DIAGNOSIS — Z23 Encounter for immunization: Secondary | ICD-10-CM | POA: Diagnosis not present

## 2016-12-24 DIAGNOSIS — E118 Type 2 diabetes mellitus with unspecified complications: Secondary | ICD-10-CM | POA: Diagnosis not present

## 2016-12-31 DIAGNOSIS — E11319 Type 2 diabetes mellitus with unspecified diabetic retinopathy without macular edema: Secondary | ICD-10-CM | POA: Diagnosis not present

## 2016-12-31 DIAGNOSIS — H524 Presbyopia: Secondary | ICD-10-CM | POA: Diagnosis not present

## 2017-01-04 DIAGNOSIS — M47812 Spondylosis without myelopathy or radiculopathy, cervical region: Secondary | ICD-10-CM | POA: Insufficient documentation

## 2017-01-04 DIAGNOSIS — M19041 Primary osteoarthritis, right hand: Secondary | ICD-10-CM | POA: Insufficient documentation

## 2017-01-04 DIAGNOSIS — M19042 Primary osteoarthritis, left hand: Secondary | ICD-10-CM

## 2017-01-04 DIAGNOSIS — M47816 Spondylosis without myelopathy or radiculopathy, lumbar region: Secondary | ICD-10-CM | POA: Insufficient documentation

## 2017-01-04 NOTE — Progress Notes (Deleted)
Office Visit Note  Patient: Sarah Nguyen             Date of Birth: 1946/04/16           MRN: [id number]             PCP: Jalene Mullet, PA-C Referring: Jalene Mullet, PA-C Visit Date: 01/06/2017 Occupation: @GUAROCC @    Subjective:  No chief complaint on file.   History of Present Illness: Sarah Nguyen is a 71 y.o. female ***   Activities of Daily Living:  Patient reports morning stiffness for *** {minute/hour:19697}.   Patient {ACTIONS;DENIES/REPORTS:21021675::"Denies"} nocturnal pain.  Difficulty dressing/grooming: {ACTIONS;DENIES/REPORTS:21021675::"Denies"} Difficulty climbing stairs: {ACTIONS;DENIES/REPORTS:21021675::"Denies"} Difficulty getting out of chair: {ACTIONS;DENIES/REPORTS:21021675::"Denies"} Difficulty using hands for taps, buttons, cutlery, and/or writing: {ACTIONS;DENIES/REPORTS:21021675::"Denies"}   No Rheumatology ROS completed.   PMFS History:  Patient Active Problem List   Diagnosis Date Noted  . Lumbar stenosis with neurogenic claudication 03/26/2016    Past Medical History:  Diagnosis Date  . Arthritis   . Cancer ([place])   . Depression   . Essential hypertension   . Family history of adverse reaction to anesthesia    patients mother had a hard time waking up after anesthesia  . Hyperlipidemia   . Seasonal allergies   . Type 2 diabetes mellitus (HCC)     Family History  Problem Relation Age of Onset  . Diabetes Mellitus II Mother   . Melanoma Father   . Stroke Mother   . Cancer Mother   . Stroke Brother   . Arrhythmia Mother    Past Surgical History:  Procedure Laterality Date  . COLONOSCOPY W/ POLYPECTOMY    . DILATION AND CURETTAGE OF UTERUS  1969  . Ingrown toenail removal  71 years old  . LUMBAR LAMINECTOMY WITH COFLEX 2 LEVEL N/A 03/26/2016   Procedure: Lumbar one-two Lumbar two -three Lumbar four-five Laminectomy with coflex;  Surgeon: Kristeen Miss, MD;  Location: [facility];  Service: Neurosurgery;  Laterality: N/A;  L1-2 L2-3  L4-5 Laminectomy with coflex  . [place]  . TONSILLECTOMY  71 years old  . VAGINAL HYSTERECTOMY  1975   Social History   Social History Narrative  . No narrative on file     Objective: Vital Signs: There were no vitals taken for this visit.   Physical Exam   Musculoskeletal Exam: ***  CDAI Exam: No CDAI exam completed.    Investigation: Findings:  Labs from 09/02/2013 show CBC with diff, CMP, sed rate, rheumatoid factor, ANA and CCP are all normal.  Uric acid is normal at 5.8.  Vitamin D is low at 24.   08/17/2013 X-rays of bilateral hands AP and oblique show bilateral CMC, PIP, DIP narrowing with no MCP changes consistent with osteoarthritis.  She has discomfort in bilateral hip joints and the x-rays 2 views were within normal limits without any hip joint narrowing.  Bilateral knee joint x-ray showed bilateral moderate medial compartment narrowing, bilateral patellofemoral narrowing without any chondrocalcinosis.      Imaging: No results found.  Speciality Comments: No specialty comments available.    Procedures:  No procedures performed Allergies: Sulfa antibiotics and Codeine   Assessment / Plan:     Visit Diagnoses: Primary osteoarthritis of both hands    Orders: No orders of the defined types were placed in this encounter.  No orders of the defined types were placed in this encounter.   Face-to-face time spent with patient was *** minutes. 50%  of time was spent in counseling and coordination of care.  Follow-Up Instructions: No Follow-up on file.   Amy Littrell, RT  Note - This record has been created using [employer].  Chart creation errors have been sought, but may not always  have been located. Such creation errors do not reflect on  the standard of medical care.

## 2017-01-05 DIAGNOSIS — E559 Vitamin D deficiency, unspecified: Secondary | ICD-10-CM | POA: Insufficient documentation

## 2017-01-05 DIAGNOSIS — Z8659 Personal history of other mental and behavioral disorders: Secondary | ICD-10-CM | POA: Insufficient documentation

## 2017-01-05 DIAGNOSIS — E785 Hyperlipidemia, unspecified: Secondary | ICD-10-CM | POA: Insufficient documentation

## 2017-01-05 DIAGNOSIS — M170 Bilateral primary osteoarthritis of knee: Secondary | ICD-10-CM | POA: Insufficient documentation

## 2017-01-05 DIAGNOSIS — I10 Essential (primary) hypertension: Secondary | ICD-10-CM | POA: Insufficient documentation

## 2017-01-06 ENCOUNTER — Ambulatory Visit: Payer: Self-pay | Admitting: Rheumatology

## 2017-01-27 DIAGNOSIS — H40013 Open angle with borderline findings, low risk, bilateral: Secondary | ICD-10-CM | POA: Diagnosis not present

## 2017-01-27 DIAGNOSIS — H2511 Age-related nuclear cataract, right eye: Secondary | ICD-10-CM | POA: Diagnosis not present

## 2017-01-27 DIAGNOSIS — H35033 Hypertensive retinopathy, bilateral: Secondary | ICD-10-CM | POA: Diagnosis not present

## 2017-01-27 DIAGNOSIS — H25013 Cortical age-related cataract, bilateral: Secondary | ICD-10-CM | POA: Diagnosis not present

## 2017-01-27 DIAGNOSIS — H2513 Age-related nuclear cataract, bilateral: Secondary | ICD-10-CM | POA: Diagnosis not present

## 2017-02-04 DIAGNOSIS — H25041 Posterior subcapsular polar age-related cataract, right eye: Secondary | ICD-10-CM | POA: Diagnosis not present

## 2017-02-04 DIAGNOSIS — H25011 Cortical age-related cataract, right eye: Secondary | ICD-10-CM | POA: Diagnosis not present

## 2017-02-04 DIAGNOSIS — H25811 Combined forms of age-related cataract, right eye: Secondary | ICD-10-CM | POA: Diagnosis not present

## 2017-02-04 DIAGNOSIS — H2511 Age-related nuclear cataract, right eye: Secondary | ICD-10-CM | POA: Diagnosis not present

## 2017-02-26 DIAGNOSIS — H25012 Cortical age-related cataract, left eye: Secondary | ICD-10-CM | POA: Diagnosis not present

## 2017-02-26 DIAGNOSIS — H2512 Age-related nuclear cataract, left eye: Secondary | ICD-10-CM | POA: Diagnosis not present

## 2017-03-04 DIAGNOSIS — H25812 Combined forms of age-related cataract, left eye: Secondary | ICD-10-CM | POA: Diagnosis not present

## 2017-03-04 DIAGNOSIS — H2512 Age-related nuclear cataract, left eye: Secondary | ICD-10-CM | POA: Diagnosis not present

## 2017-03-24 DIAGNOSIS — Z961 Presence of intraocular lens: Secondary | ICD-10-CM | POA: Diagnosis not present

## 2017-03-31 DIAGNOSIS — E118 Type 2 diabetes mellitus with unspecified complications: Secondary | ICD-10-CM | POA: Diagnosis not present

## 2017-03-31 DIAGNOSIS — E782 Mixed hyperlipidemia: Secondary | ICD-10-CM | POA: Diagnosis not present

## 2017-03-31 DIAGNOSIS — I10 Essential (primary) hypertension: Secondary | ICD-10-CM | POA: Diagnosis not present

## 2017-03-31 DIAGNOSIS — E7800 Pure hypercholesterolemia, unspecified: Secondary | ICD-10-CM | POA: Diagnosis not present

## 2017-04-08 DIAGNOSIS — M2550 Pain in unspecified joint: Secondary | ICD-10-CM | POA: Diagnosis not present

## 2017-04-08 DIAGNOSIS — E7800 Pure hypercholesterolemia, unspecified: Secondary | ICD-10-CM | POA: Diagnosis not present

## 2017-04-08 DIAGNOSIS — N189 Chronic kidney disease, unspecified: Secondary | ICD-10-CM | POA: Diagnosis not present

## 2017-04-08 DIAGNOSIS — E118 Type 2 diabetes mellitus with unspecified complications: Secondary | ICD-10-CM | POA: Diagnosis not present

## 2017-04-08 DIAGNOSIS — F321 Major depressive disorder, single episode, moderate: Secondary | ICD-10-CM | POA: Diagnosis not present

## 2017-04-08 DIAGNOSIS — I10 Essential (primary) hypertension: Secondary | ICD-10-CM | POA: Diagnosis not present

## 2017-04-08 DIAGNOSIS — Z1212 Encounter for screening for malignant neoplasm of rectum: Secondary | ICD-10-CM | POA: Diagnosis not present

## 2017-04-08 DIAGNOSIS — E119 Type 2 diabetes mellitus without complications: Secondary | ICD-10-CM | POA: Diagnosis not present

## 2017-07-07 DIAGNOSIS — I10 Essential (primary) hypertension: Secondary | ICD-10-CM | POA: Diagnosis not present

## 2017-07-07 DIAGNOSIS — R739 Hyperglycemia, unspecified: Secondary | ICD-10-CM | POA: Diagnosis not present

## 2017-07-07 DIAGNOSIS — R5382 Chronic fatigue, unspecified: Secondary | ICD-10-CM | POA: Diagnosis not present

## 2017-07-07 DIAGNOSIS — E782 Mixed hyperlipidemia: Secondary | ICD-10-CM | POA: Diagnosis not present

## 2017-07-07 DIAGNOSIS — E7800 Pure hypercholesterolemia, unspecified: Secondary | ICD-10-CM | POA: Diagnosis not present

## 2017-07-07 DIAGNOSIS — E118 Type 2 diabetes mellitus with unspecified complications: Secondary | ICD-10-CM | POA: Diagnosis not present

## 2017-07-11 DIAGNOSIS — M7062 Trochanteric bursitis, left hip: Secondary | ICD-10-CM | POA: Diagnosis not present

## 2017-07-11 DIAGNOSIS — N189 Chronic kidney disease, unspecified: Secondary | ICD-10-CM | POA: Diagnosis not present

## 2017-07-11 DIAGNOSIS — E119 Type 2 diabetes mellitus without complications: Secondary | ICD-10-CM | POA: Diagnosis not present

## 2017-07-11 DIAGNOSIS — Z6836 Body mass index (BMI) 36.0-36.9, adult: Secondary | ICD-10-CM | POA: Diagnosis not present

## 2017-07-11 DIAGNOSIS — M7061 Trochanteric bursitis, right hip: Secondary | ICD-10-CM | POA: Diagnosis not present

## 2017-07-11 DIAGNOSIS — E7800 Pure hypercholesterolemia, unspecified: Secondary | ICD-10-CM | POA: Diagnosis not present

## 2017-07-11 DIAGNOSIS — I10 Essential (primary) hypertension: Secondary | ICD-10-CM | POA: Diagnosis not present

## 2017-09-16 DIAGNOSIS — R5382 Chronic fatigue, unspecified: Secondary | ICD-10-CM | POA: Diagnosis not present

## 2017-09-16 DIAGNOSIS — E7800 Pure hypercholesterolemia, unspecified: Secondary | ICD-10-CM | POA: Diagnosis not present

## 2017-09-16 DIAGNOSIS — F321 Major depressive disorder, single episode, moderate: Secondary | ICD-10-CM | POA: Diagnosis not present

## 2017-09-16 DIAGNOSIS — N189 Chronic kidney disease, unspecified: Secondary | ICD-10-CM | POA: Diagnosis not present

## 2017-09-16 DIAGNOSIS — I10 Essential (primary) hypertension: Secondary | ICD-10-CM | POA: Diagnosis not present

## 2017-09-16 DIAGNOSIS — E782 Mixed hyperlipidemia: Secondary | ICD-10-CM | POA: Diagnosis not present

## 2017-09-16 DIAGNOSIS — E118 Type 2 diabetes mellitus with unspecified complications: Secondary | ICD-10-CM | POA: Diagnosis not present

## 2017-09-24 DIAGNOSIS — E7800 Pure hypercholesterolemia, unspecified: Secondary | ICD-10-CM | POA: Diagnosis not present

## 2017-09-24 DIAGNOSIS — Z23 Encounter for immunization: Secondary | ICD-10-CM | POA: Diagnosis not present

## 2017-09-24 DIAGNOSIS — Z1389 Encounter for screening for other disorder: Secondary | ICD-10-CM | POA: Diagnosis not present

## 2017-09-24 DIAGNOSIS — I10 Essential (primary) hypertension: Secondary | ICD-10-CM | POA: Diagnosis not present

## 2017-09-24 DIAGNOSIS — E119 Type 2 diabetes mellitus without complications: Secondary | ICD-10-CM | POA: Diagnosis not present

## 2017-09-24 DIAGNOSIS — N189 Chronic kidney disease, unspecified: Secondary | ICD-10-CM | POA: Diagnosis not present

## 2017-09-24 DIAGNOSIS — E118 Type 2 diabetes mellitus with unspecified complications: Secondary | ICD-10-CM | POA: Diagnosis not present

## 2017-09-24 DIAGNOSIS — Z1212 Encounter for screening for malignant neoplasm of rectum: Secondary | ICD-10-CM | POA: Diagnosis not present

## 2018-01-13 DIAGNOSIS — E782 Mixed hyperlipidemia: Secondary | ICD-10-CM | POA: Diagnosis not present

## 2018-01-13 DIAGNOSIS — R5382 Chronic fatigue, unspecified: Secondary | ICD-10-CM | POA: Diagnosis not present

## 2018-01-13 DIAGNOSIS — E7800 Pure hypercholesterolemia, unspecified: Secondary | ICD-10-CM | POA: Diagnosis not present

## 2018-01-13 DIAGNOSIS — E118 Type 2 diabetes mellitus with unspecified complications: Secondary | ICD-10-CM | POA: Diagnosis not present

## 2018-01-13 DIAGNOSIS — F321 Major depressive disorder, single episode, moderate: Secondary | ICD-10-CM | POA: Diagnosis not present

## 2018-01-13 DIAGNOSIS — I10 Essential (primary) hypertension: Secondary | ICD-10-CM | POA: Diagnosis not present

## 2018-01-20 DIAGNOSIS — E7800 Pure hypercholesterolemia, unspecified: Secondary | ICD-10-CM | POA: Diagnosis not present

## 2018-01-20 DIAGNOSIS — I10 Essential (primary) hypertension: Secondary | ICD-10-CM | POA: Diagnosis not present

## 2018-01-20 DIAGNOSIS — Z6836 Body mass index (BMI) 36.0-36.9, adult: Secondary | ICD-10-CM | POA: Diagnosis not present

## 2018-01-20 DIAGNOSIS — N189 Chronic kidney disease, unspecified: Secondary | ICD-10-CM | POA: Diagnosis not present

## 2018-01-20 DIAGNOSIS — E118 Type 2 diabetes mellitus with unspecified complications: Secondary | ICD-10-CM | POA: Diagnosis not present

## 2018-01-20 DIAGNOSIS — M2550 Pain in unspecified joint: Secondary | ICD-10-CM | POA: Diagnosis not present

## 2018-01-20 DIAGNOSIS — E119 Type 2 diabetes mellitus without complications: Secondary | ICD-10-CM | POA: Diagnosis not present

## 2018-01-20 DIAGNOSIS — F321 Major depressive disorder, single episode, moderate: Secondary | ICD-10-CM | POA: Diagnosis not present

## 2018-02-03 DIAGNOSIS — E118 Type 2 diabetes mellitus with unspecified complications: Secondary | ICD-10-CM | POA: Diagnosis not present

## 2018-02-03 DIAGNOSIS — E782 Mixed hyperlipidemia: Secondary | ICD-10-CM | POA: Diagnosis not present

## 2018-02-03 DIAGNOSIS — E7800 Pure hypercholesterolemia, unspecified: Secondary | ICD-10-CM | POA: Diagnosis not present

## 2018-02-03 DIAGNOSIS — R739 Hyperglycemia, unspecified: Secondary | ICD-10-CM | POA: Diagnosis not present

## 2018-02-03 DIAGNOSIS — I10 Essential (primary) hypertension: Secondary | ICD-10-CM | POA: Diagnosis not present

## 2018-02-03 DIAGNOSIS — N189 Chronic kidney disease, unspecified: Secondary | ICD-10-CM | POA: Diagnosis not present

## 2018-03-13 DIAGNOSIS — H524 Presbyopia: Secondary | ICD-10-CM | POA: Diagnosis not present

## 2018-04-20 DIAGNOSIS — I10 Essential (primary) hypertension: Secondary | ICD-10-CM | POA: Diagnosis not present

## 2018-04-20 DIAGNOSIS — E782 Mixed hyperlipidemia: Secondary | ICD-10-CM | POA: Diagnosis not present

## 2018-04-20 DIAGNOSIS — E118 Type 2 diabetes mellitus with unspecified complications: Secondary | ICD-10-CM | POA: Diagnosis not present

## 2018-04-20 DIAGNOSIS — N189 Chronic kidney disease, unspecified: Secondary | ICD-10-CM | POA: Diagnosis not present

## 2018-04-20 DIAGNOSIS — R5382 Chronic fatigue, unspecified: Secondary | ICD-10-CM | POA: Diagnosis not present

## 2018-04-20 DIAGNOSIS — F321 Major depressive disorder, single episode, moderate: Secondary | ICD-10-CM | POA: Diagnosis not present

## 2018-04-20 DIAGNOSIS — E7800 Pure hypercholesterolemia, unspecified: Secondary | ICD-10-CM | POA: Diagnosis not present

## 2018-04-28 DIAGNOSIS — E7800 Pure hypercholesterolemia, unspecified: Secondary | ICD-10-CM | POA: Diagnosis not present

## 2018-04-28 DIAGNOSIS — I10 Essential (primary) hypertension: Secondary | ICD-10-CM | POA: Diagnosis not present

## 2018-04-28 DIAGNOSIS — E118 Type 2 diabetes mellitus with unspecified complications: Secondary | ICD-10-CM | POA: Diagnosis not present

## 2018-04-28 DIAGNOSIS — N189 Chronic kidney disease, unspecified: Secondary | ICD-10-CM | POA: Diagnosis not present

## 2018-04-28 DIAGNOSIS — F321 Major depressive disorder, single episode, moderate: Secondary | ICD-10-CM | POA: Diagnosis not present

## 2018-04-28 DIAGNOSIS — Z6835 Body mass index (BMI) 35.0-35.9, adult: Secondary | ICD-10-CM | POA: Diagnosis not present

## 2018-04-28 DIAGNOSIS — M2550 Pain in unspecified joint: Secondary | ICD-10-CM | POA: Diagnosis not present

## 2018-04-28 DIAGNOSIS — E119 Type 2 diabetes mellitus without complications: Secondary | ICD-10-CM | POA: Diagnosis not present

## 2018-05-18 DIAGNOSIS — N189 Chronic kidney disease, unspecified: Secondary | ICD-10-CM | POA: Diagnosis not present

## 2018-06-29 DIAGNOSIS — E782 Mixed hyperlipidemia: Secondary | ICD-10-CM | POA: Diagnosis not present

## 2018-06-29 DIAGNOSIS — N189 Chronic kidney disease, unspecified: Secondary | ICD-10-CM | POA: Diagnosis not present

## 2018-06-29 DIAGNOSIS — R739 Hyperglycemia, unspecified: Secondary | ICD-10-CM | POA: Diagnosis not present

## 2018-06-29 DIAGNOSIS — I10 Essential (primary) hypertension: Secondary | ICD-10-CM | POA: Diagnosis not present

## 2018-06-29 DIAGNOSIS — R5382 Chronic fatigue, unspecified: Secondary | ICD-10-CM | POA: Diagnosis not present

## 2018-06-29 DIAGNOSIS — E118 Type 2 diabetes mellitus with unspecified complications: Secondary | ICD-10-CM | POA: Diagnosis not present

## 2018-06-29 DIAGNOSIS — E7800 Pure hypercholesterolemia, unspecified: Secondary | ICD-10-CM | POA: Diagnosis not present

## 2018-07-10 DIAGNOSIS — M79671 Pain in right foot: Secondary | ICD-10-CM | POA: Diagnosis not present

## 2018-07-10 DIAGNOSIS — G5790 Unspecified mononeuropathy of unspecified lower limb: Secondary | ICD-10-CM | POA: Diagnosis not present

## 2018-07-10 DIAGNOSIS — M7741 Metatarsalgia, right foot: Secondary | ICD-10-CM | POA: Diagnosis not present

## 2018-07-27 DIAGNOSIS — E782 Mixed hyperlipidemia: Secondary | ICD-10-CM | POA: Diagnosis not present

## 2018-07-27 DIAGNOSIS — R739 Hyperglycemia, unspecified: Secondary | ICD-10-CM | POA: Diagnosis not present

## 2018-07-27 DIAGNOSIS — N189 Chronic kidney disease, unspecified: Secondary | ICD-10-CM | POA: Diagnosis not present

## 2018-07-27 DIAGNOSIS — E119 Type 2 diabetes mellitus without complications: Secondary | ICD-10-CM | POA: Diagnosis not present

## 2018-07-27 DIAGNOSIS — E7801 Familial hypercholesterolemia: Secondary | ICD-10-CM | POA: Diagnosis not present

## 2018-07-27 DIAGNOSIS — R5382 Chronic fatigue, unspecified: Secondary | ICD-10-CM | POA: Diagnosis not present

## 2018-07-27 DIAGNOSIS — I10 Essential (primary) hypertension: Secondary | ICD-10-CM | POA: Diagnosis not present

## 2018-07-27 DIAGNOSIS — F321 Major depressive disorder, single episode, moderate: Secondary | ICD-10-CM | POA: Diagnosis not present

## 2018-07-31 DIAGNOSIS — G5790 Unspecified mononeuropathy of unspecified lower limb: Secondary | ICD-10-CM | POA: Diagnosis not present

## 2018-07-31 DIAGNOSIS — M7741 Metatarsalgia, right foot: Secondary | ICD-10-CM | POA: Diagnosis not present

## 2018-07-31 DIAGNOSIS — M79671 Pain in right foot: Secondary | ICD-10-CM | POA: Diagnosis not present

## 2018-08-04 DIAGNOSIS — E7800 Pure hypercholesterolemia, unspecified: Secondary | ICD-10-CM | POA: Diagnosis not present

## 2018-08-04 DIAGNOSIS — N189 Chronic kidney disease, unspecified: Secondary | ICD-10-CM | POA: Diagnosis not present

## 2018-08-04 DIAGNOSIS — E118 Type 2 diabetes mellitus with unspecified complications: Secondary | ICD-10-CM | POA: Diagnosis not present

## 2018-08-04 DIAGNOSIS — E119 Type 2 diabetes mellitus without complications: Secondary | ICD-10-CM | POA: Diagnosis not present

## 2018-08-04 DIAGNOSIS — I10 Essential (primary) hypertension: Secondary | ICD-10-CM | POA: Diagnosis not present

## 2018-08-04 DIAGNOSIS — Z6835 Body mass index (BMI) 35.0-35.9, adult: Secondary | ICD-10-CM | POA: Diagnosis not present

## 2018-08-04 DIAGNOSIS — M7061 Trochanteric bursitis, right hip: Secondary | ICD-10-CM | POA: Diagnosis not present

## 2018-08-04 DIAGNOSIS — M7062 Trochanteric bursitis, left hip: Secondary | ICD-10-CM | POA: Diagnosis not present

## 2018-09-03 DIAGNOSIS — H6993 Unspecified Eustachian tube disorder, bilateral: Secondary | ICD-10-CM | POA: Diagnosis not present

## 2018-09-03 DIAGNOSIS — H60333 Swimmer's ear, bilateral: Secondary | ICD-10-CM | POA: Diagnosis not present

## 2018-09-03 DIAGNOSIS — H903 Sensorineural hearing loss, bilateral: Secondary | ICD-10-CM | POA: Diagnosis not present

## 2018-09-03 DIAGNOSIS — J310 Chronic rhinitis: Secondary | ICD-10-CM | POA: Diagnosis not present

## 2018-10-27 DIAGNOSIS — E782 Mixed hyperlipidemia: Secondary | ICD-10-CM | POA: Diagnosis not present

## 2018-10-27 DIAGNOSIS — N189 Chronic kidney disease, unspecified: Secondary | ICD-10-CM | POA: Diagnosis not present

## 2018-10-27 DIAGNOSIS — E7800 Pure hypercholesterolemia, unspecified: Secondary | ICD-10-CM | POA: Diagnosis not present

## 2018-10-27 DIAGNOSIS — E118 Type 2 diabetes mellitus with unspecified complications: Secondary | ICD-10-CM | POA: Diagnosis not present

## 2018-10-27 DIAGNOSIS — I10 Essential (primary) hypertension: Secondary | ICD-10-CM | POA: Diagnosis not present

## 2018-10-27 DIAGNOSIS — R5382 Chronic fatigue, unspecified: Secondary | ICD-10-CM | POA: Diagnosis not present

## 2018-11-04 DIAGNOSIS — Z6835 Body mass index (BMI) 35.0-35.9, adult: Secondary | ICD-10-CM | POA: Diagnosis not present

## 2018-11-04 DIAGNOSIS — Z23 Encounter for immunization: Secondary | ICD-10-CM | POA: Diagnosis not present

## 2018-11-04 DIAGNOSIS — E118 Type 2 diabetes mellitus with unspecified complications: Secondary | ICD-10-CM | POA: Diagnosis not present

## 2018-11-04 DIAGNOSIS — M2550 Pain in unspecified joint: Secondary | ICD-10-CM | POA: Diagnosis not present

## 2018-11-04 DIAGNOSIS — E119 Type 2 diabetes mellitus without complications: Secondary | ICD-10-CM | POA: Diagnosis not present

## 2018-11-04 DIAGNOSIS — N189 Chronic kidney disease, unspecified: Secondary | ICD-10-CM | POA: Diagnosis not present

## 2018-11-04 DIAGNOSIS — I10 Essential (primary) hypertension: Secondary | ICD-10-CM | POA: Diagnosis not present

## 2018-11-04 DIAGNOSIS — E7800 Pure hypercholesterolemia, unspecified: Secondary | ICD-10-CM | POA: Diagnosis not present

## 2018-11-06 DIAGNOSIS — H6983 Other specified disorders of Eustachian tube, bilateral: Secondary | ICD-10-CM | POA: Diagnosis not present

## 2018-11-06 DIAGNOSIS — H903 Sensorineural hearing loss, bilateral: Secondary | ICD-10-CM | POA: Diagnosis not present

## 2018-11-06 DIAGNOSIS — H9209 Otalgia, unspecified ear: Secondary | ICD-10-CM | POA: Diagnosis not present

## 2018-11-14 DIAGNOSIS — E782 Mixed hyperlipidemia: Secondary | ICD-10-CM | POA: Diagnosis not present

## 2018-11-14 DIAGNOSIS — I10 Essential (primary) hypertension: Secondary | ICD-10-CM | POA: Diagnosis not present

## 2018-11-14 DIAGNOSIS — E119 Type 2 diabetes mellitus without complications: Secondary | ICD-10-CM | POA: Diagnosis not present

## 2018-12-07 ENCOUNTER — Ambulatory Visit (INDEPENDENT_AMBULATORY_CARE_PROVIDER_SITE_OTHER): Payer: PPO | Admitting: Otolaryngology

## 2018-12-07 DIAGNOSIS — H6982 Other specified disorders of Eustachian tube, left ear: Secondary | ICD-10-CM | POA: Diagnosis not present

## 2018-12-07 DIAGNOSIS — H9202 Otalgia, left ear: Secondary | ICD-10-CM

## 2018-12-14 DIAGNOSIS — I10 Essential (primary) hypertension: Secondary | ICD-10-CM | POA: Diagnosis not present

## 2018-12-14 DIAGNOSIS — E782 Mixed hyperlipidemia: Secondary | ICD-10-CM | POA: Diagnosis not present

## 2019-01-29 DIAGNOSIS — E782 Mixed hyperlipidemia: Secondary | ICD-10-CM | POA: Diagnosis not present

## 2019-01-29 DIAGNOSIS — N189 Chronic kidney disease, unspecified: Secondary | ICD-10-CM | POA: Diagnosis not present

## 2019-01-29 DIAGNOSIS — E118 Type 2 diabetes mellitus with unspecified complications: Secondary | ICD-10-CM | POA: Diagnosis not present

## 2019-01-29 DIAGNOSIS — E7800 Pure hypercholesterolemia, unspecified: Secondary | ICD-10-CM | POA: Diagnosis not present

## 2019-01-29 DIAGNOSIS — E7801 Familial hypercholesterolemia: Secondary | ICD-10-CM | POA: Diagnosis not present

## 2019-01-29 DIAGNOSIS — I10 Essential (primary) hypertension: Secondary | ICD-10-CM | POA: Diagnosis not present

## 2019-01-29 DIAGNOSIS — R739 Hyperglycemia, unspecified: Secondary | ICD-10-CM | POA: Diagnosis not present

## 2019-02-03 DIAGNOSIS — E119 Type 2 diabetes mellitus without complications: Secondary | ICD-10-CM | POA: Diagnosis not present

## 2019-02-03 DIAGNOSIS — E7800 Pure hypercholesterolemia, unspecified: Secondary | ICD-10-CM | POA: Diagnosis not present

## 2019-02-03 DIAGNOSIS — M2550 Pain in unspecified joint: Secondary | ICD-10-CM | POA: Diagnosis not present

## 2019-02-03 DIAGNOSIS — E118 Type 2 diabetes mellitus with unspecified complications: Secondary | ICD-10-CM | POA: Diagnosis not present

## 2019-02-03 DIAGNOSIS — I10 Essential (primary) hypertension: Secondary | ICD-10-CM | POA: Diagnosis not present

## 2019-02-03 DIAGNOSIS — Z6835 Body mass index (BMI) 35.0-35.9, adult: Secondary | ICD-10-CM | POA: Diagnosis not present

## 2019-02-03 DIAGNOSIS — F321 Major depressive disorder, single episode, moderate: Secondary | ICD-10-CM | POA: Diagnosis not present

## 2019-02-03 DIAGNOSIS — N189 Chronic kidney disease, unspecified: Secondary | ICD-10-CM | POA: Diagnosis not present

## 2019-02-12 DIAGNOSIS — E7800 Pure hypercholesterolemia, unspecified: Secondary | ICD-10-CM | POA: Diagnosis not present

## 2019-02-12 DIAGNOSIS — E119 Type 2 diabetes mellitus without complications: Secondary | ICD-10-CM | POA: Diagnosis not present

## 2019-02-12 DIAGNOSIS — I10 Essential (primary) hypertension: Secondary | ICD-10-CM | POA: Diagnosis not present

## 2019-05-03 DIAGNOSIS — R739 Hyperglycemia, unspecified: Secondary | ICD-10-CM | POA: Diagnosis not present

## 2019-05-03 DIAGNOSIS — I10 Essential (primary) hypertension: Secondary | ICD-10-CM | POA: Diagnosis not present

## 2019-05-03 DIAGNOSIS — E7800 Pure hypercholesterolemia, unspecified: Secondary | ICD-10-CM | POA: Diagnosis not present

## 2019-05-03 DIAGNOSIS — N189 Chronic kidney disease, unspecified: Secondary | ICD-10-CM | POA: Diagnosis not present

## 2019-05-03 DIAGNOSIS — R5382 Chronic fatigue, unspecified: Secondary | ICD-10-CM | POA: Diagnosis not present

## 2019-05-03 DIAGNOSIS — E782 Mixed hyperlipidemia: Secondary | ICD-10-CM | POA: Diagnosis not present

## 2019-05-03 DIAGNOSIS — E119 Type 2 diabetes mellitus without complications: Secondary | ICD-10-CM | POA: Diagnosis not present

## 2019-05-07 DIAGNOSIS — E119 Type 2 diabetes mellitus without complications: Secondary | ICD-10-CM | POA: Diagnosis not present

## 2019-05-07 DIAGNOSIS — F321 Major depressive disorder, single episode, moderate: Secondary | ICD-10-CM | POA: Diagnosis not present

## 2019-05-07 DIAGNOSIS — N189 Chronic kidney disease, unspecified: Secondary | ICD-10-CM | POA: Diagnosis not present

## 2019-05-07 DIAGNOSIS — E7800 Pure hypercholesterolemia, unspecified: Secondary | ICD-10-CM | POA: Diagnosis not present

## 2019-05-07 DIAGNOSIS — I10 Essential (primary) hypertension: Secondary | ICD-10-CM | POA: Diagnosis not present

## 2019-05-07 DIAGNOSIS — E118 Type 2 diabetes mellitus with unspecified complications: Secondary | ICD-10-CM | POA: Diagnosis not present

## 2019-05-07 DIAGNOSIS — M2550 Pain in unspecified joint: Secondary | ICD-10-CM | POA: Diagnosis not present

## 2019-05-07 DIAGNOSIS — Z6836 Body mass index (BMI) 36.0-36.9, adult: Secondary | ICD-10-CM | POA: Diagnosis not present

## 2019-07-20 DIAGNOSIS — K130 Diseases of lips: Secondary | ICD-10-CM | POA: Diagnosis not present

## 2019-07-20 DIAGNOSIS — Z6835 Body mass index (BMI) 35.0-35.9, adult: Secondary | ICD-10-CM | POA: Diagnosis not present

## 2019-08-04 DIAGNOSIS — E782 Mixed hyperlipidemia: Secondary | ICD-10-CM | POA: Diagnosis not present

## 2019-08-04 DIAGNOSIS — I10 Essential (primary) hypertension: Secondary | ICD-10-CM | POA: Diagnosis not present

## 2019-08-04 DIAGNOSIS — N189 Chronic kidney disease, unspecified: Secondary | ICD-10-CM | POA: Diagnosis not present

## 2019-08-04 DIAGNOSIS — E118 Type 2 diabetes mellitus with unspecified complications: Secondary | ICD-10-CM | POA: Diagnosis not present

## 2019-08-04 DIAGNOSIS — R5382 Chronic fatigue, unspecified: Secondary | ICD-10-CM | POA: Diagnosis not present

## 2019-08-04 DIAGNOSIS — E7801 Familial hypercholesterolemia: Secondary | ICD-10-CM | POA: Diagnosis not present

## 2019-08-17 DIAGNOSIS — I10 Essential (primary) hypertension: Secondary | ICD-10-CM | POA: Diagnosis not present

## 2019-08-17 DIAGNOSIS — N189 Chronic kidney disease, unspecified: Secondary | ICD-10-CM | POA: Diagnosis not present

## 2019-08-17 DIAGNOSIS — Z6835 Body mass index (BMI) 35.0-35.9, adult: Secondary | ICD-10-CM | POA: Diagnosis not present

## 2019-08-17 DIAGNOSIS — Z0001 Encounter for general adult medical examination with abnormal findings: Secondary | ICD-10-CM | POA: Diagnosis not present

## 2019-08-17 DIAGNOSIS — Z23 Encounter for immunization: Secondary | ICD-10-CM | POA: Diagnosis not present

## 2019-08-17 DIAGNOSIS — E118 Type 2 diabetes mellitus with unspecified complications: Secondary | ICD-10-CM | POA: Diagnosis not present

## 2019-08-17 DIAGNOSIS — E119 Type 2 diabetes mellitus without complications: Secondary | ICD-10-CM | POA: Diagnosis not present

## 2019-08-17 DIAGNOSIS — R1310 Dysphagia, unspecified: Secondary | ICD-10-CM | POA: Diagnosis not present

## 2019-09-07 DIAGNOSIS — Z8371 Family history of colonic polyps: Secondary | ICD-10-CM | POA: Diagnosis not present

## 2019-09-07 DIAGNOSIS — Z86010 Personal history of colonic polyps: Secondary | ICD-10-CM | POA: Diagnosis not present

## 2019-09-14 DIAGNOSIS — R1310 Dysphagia, unspecified: Secondary | ICD-10-CM | POA: Diagnosis not present

## 2019-09-14 DIAGNOSIS — R4702 Dysphasia: Secondary | ICD-10-CM | POA: Diagnosis not present

## 2019-09-17 DIAGNOSIS — Z01818 Encounter for other preprocedural examination: Secondary | ICD-10-CM | POA: Diagnosis not present

## 2019-09-20 DIAGNOSIS — Z86010 Personal history of colonic polyps: Secondary | ICD-10-CM | POA: Diagnosis not present

## 2019-09-20 DIAGNOSIS — Z1211 Encounter for screening for malignant neoplasm of colon: Secondary | ICD-10-CM | POA: Diagnosis not present

## 2019-09-20 DIAGNOSIS — Z8371 Family history of colonic polyps: Secondary | ICD-10-CM | POA: Diagnosis not present

## 2019-09-20 DIAGNOSIS — Z09 Encounter for follow-up examination after completed treatment for conditions other than malignant neoplasm: Secondary | ICD-10-CM | POA: Diagnosis not present

## 2019-09-20 DIAGNOSIS — E785 Hyperlipidemia, unspecified: Secondary | ICD-10-CM | POA: Diagnosis not present

## 2019-09-20 DIAGNOSIS — E119 Type 2 diabetes mellitus without complications: Secondary | ICD-10-CM | POA: Diagnosis not present

## 2019-09-20 DIAGNOSIS — Z886 Allergy status to analgesic agent status: Secondary | ICD-10-CM | POA: Diagnosis not present

## 2019-09-20 DIAGNOSIS — Z882 Allergy status to sulfonamides status: Secondary | ICD-10-CM | POA: Diagnosis not present

## 2019-09-20 DIAGNOSIS — I10 Essential (primary) hypertension: Secondary | ICD-10-CM | POA: Diagnosis not present

## 2019-09-20 DIAGNOSIS — Z79899 Other long term (current) drug therapy: Secondary | ICD-10-CM | POA: Diagnosis not present

## 2019-09-20 DIAGNOSIS — Z7984 Long term (current) use of oral hypoglycemic drugs: Secondary | ICD-10-CM | POA: Diagnosis not present

## 2019-09-20 DIAGNOSIS — Z90710 Acquired absence of both cervix and uterus: Secondary | ICD-10-CM | POA: Diagnosis not present

## 2019-10-06 DIAGNOSIS — Z6834 Body mass index (BMI) 34.0-34.9, adult: Secondary | ICD-10-CM | POA: Diagnosis not present

## 2019-10-06 DIAGNOSIS — L03032 Cellulitis of left toe: Secondary | ICD-10-CM | POA: Diagnosis not present

## 2019-11-12 DIAGNOSIS — N189 Chronic kidney disease, unspecified: Secondary | ICD-10-CM | POA: Diagnosis not present

## 2019-11-12 DIAGNOSIS — E7801 Familial hypercholesterolemia: Secondary | ICD-10-CM | POA: Diagnosis not present

## 2019-11-12 DIAGNOSIS — I10 Essential (primary) hypertension: Secondary | ICD-10-CM | POA: Diagnosis not present

## 2019-11-12 DIAGNOSIS — E118 Type 2 diabetes mellitus with unspecified complications: Secondary | ICD-10-CM | POA: Diagnosis not present

## 2019-11-12 DIAGNOSIS — E782 Mixed hyperlipidemia: Secondary | ICD-10-CM | POA: Diagnosis not present

## 2019-11-12 DIAGNOSIS — E7800 Pure hypercholesterolemia, unspecified: Secondary | ICD-10-CM | POA: Diagnosis not present

## 2019-11-12 DIAGNOSIS — R739 Hyperglycemia, unspecified: Secondary | ICD-10-CM | POA: Diagnosis not present

## 2019-11-12 DIAGNOSIS — R5382 Chronic fatigue, unspecified: Secondary | ICD-10-CM | POA: Diagnosis not present

## 2019-11-16 DIAGNOSIS — Z6835 Body mass index (BMI) 35.0-35.9, adult: Secondary | ICD-10-CM | POA: Diagnosis not present

## 2019-11-16 DIAGNOSIS — I10 Essential (primary) hypertension: Secondary | ICD-10-CM | POA: Diagnosis not present

## 2019-11-16 DIAGNOSIS — E119 Type 2 diabetes mellitus without complications: Secondary | ICD-10-CM | POA: Diagnosis not present

## 2019-11-16 DIAGNOSIS — N189 Chronic kidney disease, unspecified: Secondary | ICD-10-CM | POA: Diagnosis not present

## 2019-11-16 DIAGNOSIS — Z1212 Encounter for screening for malignant neoplasm of rectum: Secondary | ICD-10-CM | POA: Diagnosis not present

## 2019-11-16 DIAGNOSIS — Z6836 Body mass index (BMI) 36.0-36.9, adult: Secondary | ICD-10-CM | POA: Diagnosis not present

## 2019-11-16 DIAGNOSIS — M2550 Pain in unspecified joint: Secondary | ICD-10-CM | POA: Diagnosis not present

## 2019-11-16 DIAGNOSIS — F321 Major depressive disorder, single episode, moderate: Secondary | ICD-10-CM | POA: Diagnosis not present

## 2019-11-16 DIAGNOSIS — E118 Type 2 diabetes mellitus with unspecified complications: Secondary | ICD-10-CM | POA: Diagnosis not present

## 2019-11-16 DIAGNOSIS — Z1331 Encounter for screening for depression: Secondary | ICD-10-CM | POA: Diagnosis not present

## 2019-11-16 DIAGNOSIS — E7800 Pure hypercholesterolemia, unspecified: Secondary | ICD-10-CM | POA: Diagnosis not present

## 2019-11-16 DIAGNOSIS — Z1389 Encounter for screening for other disorder: Secondary | ICD-10-CM | POA: Diagnosis not present

## 2020-01-27 ENCOUNTER — Ambulatory Visit: Payer: PPO | Attending: Internal Medicine

## 2020-01-27 DIAGNOSIS — Z23 Encounter for immunization: Secondary | ICD-10-CM | POA: Insufficient documentation

## 2020-01-27 NOTE — Progress Notes (Signed)
   Covid-19 Vaccination Clinic  Name:  SHERESE TATTON    MRN: [id number] DOB: [birth year]  01/27/2020  Ms. Durnil was observed post Covid-19 immunization for 15 minutes without incidence. She was provided with Vaccine Information Sheet and instruction to access the V-Safe system.   Ms. Jaracz was instructed to call 911 with any severe reactions post vaccine: Marland Kitchen Difficulty breathing  . Swelling of your face and throat  . A fast heartbeat  . A bad rash all over your body  . Dizziness and weakness    Immunizations Administered    Name Date Dose VIS Date Route   Pfizer COVID-19 Vaccine 01/27/2020  8:25 AM 0.3 mL 11/26/2019 Intramuscular   Manufacturer: [place]   Lot: [id number]   [place]: [id number]

## 2020-02-07 DIAGNOSIS — E118 Type 2 diabetes mellitus with unspecified complications: Secondary | ICD-10-CM | POA: Diagnosis not present

## 2020-02-07 DIAGNOSIS — E782 Mixed hyperlipidemia: Secondary | ICD-10-CM | POA: Diagnosis not present

## 2020-02-07 DIAGNOSIS — E7801 Familial hypercholesterolemia: Secondary | ICD-10-CM | POA: Diagnosis not present

## 2020-02-07 DIAGNOSIS — I10 Essential (primary) hypertension: Secondary | ICD-10-CM | POA: Diagnosis not present

## 2020-02-09 DIAGNOSIS — E119 Type 2 diabetes mellitus without complications: Secondary | ICD-10-CM | POA: Diagnosis not present

## 2020-02-09 DIAGNOSIS — F321 Major depressive disorder, single episode, moderate: Secondary | ICD-10-CM | POA: Diagnosis not present

## 2020-02-09 DIAGNOSIS — N189 Chronic kidney disease, unspecified: Secondary | ICD-10-CM | POA: Diagnosis not present

## 2020-02-09 DIAGNOSIS — M2550 Pain in unspecified joint: Secondary | ICD-10-CM | POA: Diagnosis not present

## 2020-02-09 DIAGNOSIS — R1310 Dysphagia, unspecified: Secondary | ICD-10-CM | POA: Diagnosis not present

## 2020-02-09 DIAGNOSIS — Z6835 Body mass index (BMI) 35.0-35.9, adult: Secondary | ICD-10-CM | POA: Diagnosis not present

## 2020-02-09 DIAGNOSIS — E118 Type 2 diabetes mellitus with unspecified complications: Secondary | ICD-10-CM | POA: Diagnosis not present

## 2020-02-09 DIAGNOSIS — I10 Essential (primary) hypertension: Secondary | ICD-10-CM | POA: Diagnosis not present

## 2020-02-23 ENCOUNTER — Ambulatory Visit: Payer: PPO | Attending: Internal Medicine

## 2020-02-23 DIAGNOSIS — Z23 Encounter for immunization: Secondary | ICD-10-CM | POA: Insufficient documentation

## 2020-02-23 NOTE — Progress Notes (Signed)
   Covid-19 Vaccination Clinic  Name:  [name]    MRN: [id number] DOB: 27-Jun-1946  02/23/2020  [name] was observed post Covid-19 immunization for 15 minutes without incident. She was provided with Vaccine Information Sheet and instruction to access the V-Safe system.   [name] was instructed to call 911 with any severe reactions post vaccine: Marland Kitchen Difficulty breathing  . Swelling of face and throat  . A fast heartbeat  . A bad rash all over body  . Dizziness and weakness   Immunizations Administered    Name Date Dose VIS Date Route   Pfizer COVID-19 Vaccine 02/23/2020  8:06 AM 0.3 mL 11/26/2019 Intramuscular   Manufacturer: [place]   Lot: [id number]   [place]: [id number]

## 2020-02-29 DIAGNOSIS — Z6836 Body mass index (BMI) 36.0-36.9, adult: Secondary | ICD-10-CM | POA: Diagnosis not present

## 2020-02-29 DIAGNOSIS — T148XXA Other injury of unspecified body region, initial encounter: Secondary | ICD-10-CM | POA: Diagnosis not present

## 2020-03-03 DIAGNOSIS — M79645 Pain in left finger(s): Secondary | ICD-10-CM | POA: Diagnosis not present

## 2020-03-09 DIAGNOSIS — M1812 Unilateral primary osteoarthritis of first carpometacarpal joint, left hand: Secondary | ICD-10-CM | POA: Diagnosis not present

## 2020-04-01 DIAGNOSIS — F329 Major depressive disorder, single episode, unspecified: Secondary | ICD-10-CM | POA: Diagnosis not present

## 2020-04-01 DIAGNOSIS — M7989 Other specified soft tissue disorders: Secondary | ICD-10-CM | POA: Diagnosis not present

## 2020-04-01 DIAGNOSIS — Z884 Allergy status to anesthetic agent status: Secondary | ICD-10-CM | POA: Diagnosis not present

## 2020-04-01 DIAGNOSIS — Z885 Allergy status to narcotic agent status: Secondary | ICD-10-CM | POA: Diagnosis not present

## 2020-04-01 DIAGNOSIS — I82811 Embolism and thrombosis of superficial veins of right lower extremities: Secondary | ICD-10-CM | POA: Diagnosis not present

## 2020-04-01 DIAGNOSIS — E1122 Type 2 diabetes mellitus with diabetic chronic kidney disease: Secondary | ICD-10-CM | POA: Diagnosis not present

## 2020-04-01 DIAGNOSIS — I129 Hypertensive chronic kidney disease with stage 1 through stage 4 chronic kidney disease, or unspecified chronic kidney disease: Secondary | ICD-10-CM | POA: Diagnosis not present

## 2020-04-01 DIAGNOSIS — R600 Localized edema: Secondary | ICD-10-CM | POA: Diagnosis not present

## 2020-04-01 DIAGNOSIS — N189 Chronic kidney disease, unspecified: Secondary | ICD-10-CM | POA: Diagnosis not present

## 2020-04-01 DIAGNOSIS — Z79899 Other long term (current) drug therapy: Secondary | ICD-10-CM | POA: Diagnosis not present

## 2020-04-01 DIAGNOSIS — Z882 Allergy status to sulfonamides status: Secondary | ICD-10-CM | POA: Diagnosis not present

## 2020-04-01 DIAGNOSIS — Z7984 Long term (current) use of oral hypoglycemic drugs: Secondary | ICD-10-CM | POA: Diagnosis not present

## 2020-04-01 DIAGNOSIS — E785 Hyperlipidemia, unspecified: Secondary | ICD-10-CM | POA: Diagnosis not present

## 2020-04-01 DIAGNOSIS — M1990 Unspecified osteoarthritis, unspecified site: Secondary | ICD-10-CM | POA: Diagnosis not present

## 2020-04-03 DIAGNOSIS — I8001 Phlebitis and thrombophlebitis of superficial vessels of right lower extremity: Secondary | ICD-10-CM | POA: Diagnosis not present

## 2020-04-03 DIAGNOSIS — R600 Localized edema: Secondary | ICD-10-CM | POA: Diagnosis not present

## 2020-04-03 DIAGNOSIS — I82811 Embolism and thrombosis of superficial veins of right lower extremities: Secondary | ICD-10-CM | POA: Diagnosis not present

## 2020-04-19 DIAGNOSIS — E118 Type 2 diabetes mellitus with unspecified complications: Secondary | ICD-10-CM | POA: Diagnosis not present

## 2020-04-19 DIAGNOSIS — R1310 Dysphagia, unspecified: Secondary | ICD-10-CM | POA: Diagnosis not present

## 2020-04-19 DIAGNOSIS — E119 Type 2 diabetes mellitus without complications: Secondary | ICD-10-CM | POA: Diagnosis not present

## 2020-04-19 DIAGNOSIS — E782 Mixed hyperlipidemia: Secondary | ICD-10-CM | POA: Diagnosis not present

## 2020-04-19 DIAGNOSIS — N189 Chronic kidney disease, unspecified: Secondary | ICD-10-CM | POA: Diagnosis not present

## 2020-04-19 DIAGNOSIS — Z6835 Body mass index (BMI) 35.0-35.9, adult: Secondary | ICD-10-CM | POA: Diagnosis not present

## 2020-04-19 DIAGNOSIS — M1990 Unspecified osteoarthritis, unspecified site: Secondary | ICD-10-CM | POA: Diagnosis not present

## 2020-04-19 DIAGNOSIS — I10 Essential (primary) hypertension: Secondary | ICD-10-CM | POA: Diagnosis not present

## 2020-05-03 DIAGNOSIS — N189 Chronic kidney disease, unspecified: Secondary | ICD-10-CM | POA: Diagnosis not present

## 2020-05-03 DIAGNOSIS — E118 Type 2 diabetes mellitus with unspecified complications: Secondary | ICD-10-CM | POA: Diagnosis not present

## 2020-05-03 DIAGNOSIS — E782 Mixed hyperlipidemia: Secondary | ICD-10-CM | POA: Diagnosis not present

## 2020-05-03 DIAGNOSIS — R739 Hyperglycemia, unspecified: Secondary | ICD-10-CM | POA: Diagnosis not present

## 2020-05-03 DIAGNOSIS — I10 Essential (primary) hypertension: Secondary | ICD-10-CM | POA: Diagnosis not present

## 2020-05-10 DIAGNOSIS — M2550 Pain in unspecified joint: Secondary | ICD-10-CM | POA: Diagnosis not present

## 2020-05-10 DIAGNOSIS — E782 Mixed hyperlipidemia: Secondary | ICD-10-CM | POA: Diagnosis not present

## 2020-05-10 DIAGNOSIS — E119 Type 2 diabetes mellitus without complications: Secondary | ICD-10-CM | POA: Diagnosis not present

## 2020-05-10 DIAGNOSIS — E118 Type 2 diabetes mellitus with unspecified complications: Secondary | ICD-10-CM | POA: Diagnosis not present

## 2020-05-10 DIAGNOSIS — Z6835 Body mass index (BMI) 35.0-35.9, adult: Secondary | ICD-10-CM | POA: Diagnosis not present

## 2020-05-10 DIAGNOSIS — R1310 Dysphagia, unspecified: Secondary | ICD-10-CM | POA: Diagnosis not present

## 2020-05-10 DIAGNOSIS — I10 Essential (primary) hypertension: Secondary | ICD-10-CM | POA: Diagnosis not present

## 2020-05-10 DIAGNOSIS — N189 Chronic kidney disease, unspecified: Secondary | ICD-10-CM | POA: Diagnosis not present

## 2020-07-21 DIAGNOSIS — M5416 Radiculopathy, lumbar region: Secondary | ICD-10-CM | POA: Diagnosis not present

## 2020-07-21 DIAGNOSIS — G8929 Other chronic pain: Secondary | ICD-10-CM | POA: Diagnosis not present

## 2020-08-04 DIAGNOSIS — E782 Mixed hyperlipidemia: Secondary | ICD-10-CM | POA: Diagnosis not present

## 2020-08-04 DIAGNOSIS — R739 Hyperglycemia, unspecified: Secondary | ICD-10-CM | POA: Diagnosis not present

## 2020-08-04 DIAGNOSIS — R5382 Chronic fatigue, unspecified: Secondary | ICD-10-CM | POA: Diagnosis not present

## 2020-08-04 DIAGNOSIS — E118 Type 2 diabetes mellitus with unspecified complications: Secondary | ICD-10-CM | POA: Diagnosis not present

## 2020-08-04 DIAGNOSIS — N189 Chronic kidney disease, unspecified: Secondary | ICD-10-CM | POA: Diagnosis not present

## 2020-08-04 DIAGNOSIS — I10 Essential (primary) hypertension: Secondary | ICD-10-CM | POA: Diagnosis not present

## 2020-08-09 DIAGNOSIS — E782 Mixed hyperlipidemia: Secondary | ICD-10-CM | POA: Diagnosis not present

## 2020-08-09 DIAGNOSIS — I10 Essential (primary) hypertension: Secondary | ICD-10-CM | POA: Diagnosis not present

## 2020-08-09 DIAGNOSIS — E119 Type 2 diabetes mellitus without complications: Secondary | ICD-10-CM | POA: Diagnosis not present

## 2020-08-09 DIAGNOSIS — R1310 Dysphagia, unspecified: Secondary | ICD-10-CM | POA: Diagnosis not present

## 2020-08-09 DIAGNOSIS — Z6835 Body mass index (BMI) 35.0-35.9, adult: Secondary | ICD-10-CM | POA: Diagnosis not present

## 2020-08-09 DIAGNOSIS — E118 Type 2 diabetes mellitus with unspecified complications: Secondary | ICD-10-CM | POA: Diagnosis not present

## 2020-08-09 DIAGNOSIS — M2550 Pain in unspecified joint: Secondary | ICD-10-CM | POA: Diagnosis not present

## 2020-08-09 DIAGNOSIS — F321 Major depressive disorder, single episode, moderate: Secondary | ICD-10-CM | POA: Diagnosis not present

## 2020-08-10 DIAGNOSIS — M5416 Radiculopathy, lumbar region: Secondary | ICD-10-CM | POA: Diagnosis not present

## 2020-08-10 DIAGNOSIS — M5116 Intervertebral disc disorders with radiculopathy, lumbar region: Secondary | ICD-10-CM | POA: Diagnosis not present

## 2020-09-14 DIAGNOSIS — Z23 Encounter for immunization: Secondary | ICD-10-CM | POA: Diagnosis not present

## 2020-09-28 ENCOUNTER — Ambulatory Visit: Payer: PPO | Attending: Internal Medicine

## 2020-09-28 DIAGNOSIS — Z23 Encounter for immunization: Secondary | ICD-10-CM

## 2020-09-28 NOTE — Progress Notes (Signed)
   Covid-19 Vaccination Clinic  Name:  KAMILLAH DIDONATO    MRN: [id number] DOB: 05-Apr-1946  09/28/2020  Ms. Heymann was observed post Covid-19 immunization for 15 minutes without incident. She was provided with Vaccine Information Sheet and instruction to access the V-Safe system.   Ms. Dobosz was instructed to call 911 with any severe reactions post vaccine: Marland Kitchen Difficulty breathing  . Swelling of face and throat  . A fast heartbeat  . A bad rash all over body  . Dizziness and weakness

## 2020-10-14 DIAGNOSIS — Z7984 Long term (current) use of oral hypoglycemic drugs: Secondary | ICD-10-CM | POA: Diagnosis not present

## 2020-10-14 DIAGNOSIS — N189 Chronic kidney disease, unspecified: Secondary | ICD-10-CM | POA: Diagnosis not present

## 2020-10-14 DIAGNOSIS — I129 Hypertensive chronic kidney disease with stage 1 through stage 4 chronic kidney disease, or unspecified chronic kidney disease: Secondary | ICD-10-CM | POA: Diagnosis not present

## 2020-10-14 DIAGNOSIS — E119 Type 2 diabetes mellitus without complications: Secondary | ICD-10-CM | POA: Diagnosis not present

## 2020-11-06 DIAGNOSIS — E118 Type 2 diabetes mellitus with unspecified complications: Secondary | ICD-10-CM | POA: Diagnosis not present

## 2020-11-06 DIAGNOSIS — N189 Chronic kidney disease, unspecified: Secondary | ICD-10-CM | POA: Diagnosis not present

## 2020-11-06 DIAGNOSIS — E782 Mixed hyperlipidemia: Secondary | ICD-10-CM | POA: Diagnosis not present

## 2020-11-08 DIAGNOSIS — Z6835 Body mass index (BMI) 35.0-35.9, adult: Secondary | ICD-10-CM | POA: Diagnosis not present

## 2020-11-08 DIAGNOSIS — I10 Essential (primary) hypertension: Secondary | ICD-10-CM | POA: Diagnosis not present

## 2020-11-08 DIAGNOSIS — E119 Type 2 diabetes mellitus without complications: Secondary | ICD-10-CM | POA: Diagnosis not present

## 2020-11-08 DIAGNOSIS — E118 Type 2 diabetes mellitus with unspecified complications: Secondary | ICD-10-CM | POA: Diagnosis not present

## 2020-11-08 DIAGNOSIS — E782 Mixed hyperlipidemia: Secondary | ICD-10-CM | POA: Diagnosis not present

## 2020-11-08 DIAGNOSIS — N189 Chronic kidney disease, unspecified: Secondary | ICD-10-CM | POA: Diagnosis not present

## 2020-11-08 DIAGNOSIS — R1310 Dysphagia, unspecified: Secondary | ICD-10-CM | POA: Diagnosis not present

## 2020-11-08 DIAGNOSIS — M2550 Pain in unspecified joint: Secondary | ICD-10-CM | POA: Diagnosis not present

## 2020-11-15 DIAGNOSIS — R1310 Dysphagia, unspecified: Secondary | ICD-10-CM | POA: Diagnosis not present

## 2020-11-15 DIAGNOSIS — M2550 Pain in unspecified joint: Secondary | ICD-10-CM | POA: Diagnosis not present

## 2020-11-15 DIAGNOSIS — F321 Major depressive disorder, single episode, moderate: Secondary | ICD-10-CM | POA: Diagnosis not present

## 2020-11-15 DIAGNOSIS — E782 Mixed hyperlipidemia: Secondary | ICD-10-CM | POA: Diagnosis not present

## 2020-11-15 DIAGNOSIS — E118 Type 2 diabetes mellitus with unspecified complications: Secondary | ICD-10-CM | POA: Diagnosis not present

## 2020-11-15 DIAGNOSIS — M7061 Trochanteric bursitis, right hip: Secondary | ICD-10-CM | POA: Diagnosis not present

## 2020-11-15 DIAGNOSIS — E119 Type 2 diabetes mellitus without complications: Secondary | ICD-10-CM | POA: Diagnosis not present

## 2020-11-15 DIAGNOSIS — M7062 Trochanteric bursitis, left hip: Secondary | ICD-10-CM | POA: Diagnosis not present

## 2021-02-05 DIAGNOSIS — E7849 Other hyperlipidemia: Secondary | ICD-10-CM | POA: Diagnosis not present

## 2021-02-05 DIAGNOSIS — R739 Hyperglycemia, unspecified: Secondary | ICD-10-CM | POA: Diagnosis not present

## 2021-02-05 DIAGNOSIS — R5382 Chronic fatigue, unspecified: Secondary | ICD-10-CM | POA: Diagnosis not present

## 2021-02-05 DIAGNOSIS — N189 Chronic kidney disease, unspecified: Secondary | ICD-10-CM | POA: Diagnosis not present

## 2021-02-05 DIAGNOSIS — E118 Type 2 diabetes mellitus with unspecified complications: Secondary | ICD-10-CM | POA: Diagnosis not present

## 2021-02-05 DIAGNOSIS — I10 Essential (primary) hypertension: Secondary | ICD-10-CM | POA: Diagnosis not present

## 2021-02-05 DIAGNOSIS — E782 Mixed hyperlipidemia: Secondary | ICD-10-CM | POA: Diagnosis not present

## 2021-02-07 DIAGNOSIS — Z1389 Encounter for screening for other disorder: Secondary | ICD-10-CM | POA: Diagnosis not present

## 2021-02-07 DIAGNOSIS — I10 Essential (primary) hypertension: Secondary | ICD-10-CM | POA: Diagnosis not present

## 2021-02-07 DIAGNOSIS — Z0001 Encounter for general adult medical examination with abnormal findings: Secondary | ICD-10-CM | POA: Diagnosis not present

## 2021-02-07 DIAGNOSIS — Z1331 Encounter for screening for depression: Secondary | ICD-10-CM | POA: Diagnosis not present

## 2021-02-07 DIAGNOSIS — R1310 Dysphagia, unspecified: Secondary | ICD-10-CM | POA: Diagnosis not present

## 2021-02-07 DIAGNOSIS — E7849 Other hyperlipidemia: Secondary | ICD-10-CM | POA: Diagnosis not present

## 2021-02-07 DIAGNOSIS — E118 Type 2 diabetes mellitus with unspecified complications: Secondary | ICD-10-CM | POA: Diagnosis not present

## 2021-02-07 DIAGNOSIS — E119 Type 2 diabetes mellitus without complications: Secondary | ICD-10-CM | POA: Diagnosis not present

## 2021-02-08 ENCOUNTER — Other Ambulatory Visit (HOSPITAL_COMMUNITY): Payer: Self-pay | Admitting: Family Medicine

## 2021-02-08 DIAGNOSIS — Z1231 Encounter for screening mammogram for malignant neoplasm of breast: Secondary | ICD-10-CM

## 2021-02-16 ENCOUNTER — Ambulatory Visit (HOSPITAL_COMMUNITY): Payer: PPO

## 2021-02-21 ENCOUNTER — Ambulatory Visit (HOSPITAL_COMMUNITY)
Admission: RE | Admit: 2021-02-21 | Discharge: 2021-02-21 | Disposition: A | Discharge status: Home / Self Care | Payer: PPO | Source: Ambulatory Visit | Attending: Family Medicine | Admitting: Family Medicine

## 2021-02-21 ENCOUNTER — Other Ambulatory Visit: Payer: Self-pay

## 2021-02-21 DIAGNOSIS — Z1231 Encounter for screening mammogram for malignant neoplasm of breast: Secondary | ICD-10-CM | POA: Insufficient documentation

## 2021-04-24 DIAGNOSIS — E7849 Other hyperlipidemia: Secondary | ICD-10-CM | POA: Diagnosis not present

## 2021-04-24 DIAGNOSIS — E782 Mixed hyperlipidemia: Secondary | ICD-10-CM | POA: Diagnosis not present

## 2021-04-24 DIAGNOSIS — E118 Type 2 diabetes mellitus with unspecified complications: Secondary | ICD-10-CM | POA: Diagnosis not present

## 2021-04-24 DIAGNOSIS — I10 Essential (primary) hypertension: Secondary | ICD-10-CM | POA: Diagnosis not present

## 2021-04-26 DIAGNOSIS — M5116 Intervertebral disc disorders with radiculopathy, lumbar region: Secondary | ICD-10-CM | POA: Diagnosis not present

## 2021-04-26 DIAGNOSIS — M5416 Radiculopathy, lumbar region: Secondary | ICD-10-CM | POA: Diagnosis not present

## 2021-05-02 DIAGNOSIS — M7061 Trochanteric bursitis, right hip: Secondary | ICD-10-CM | POA: Diagnosis not present

## 2021-05-02 DIAGNOSIS — I10 Essential (primary) hypertension: Secondary | ICD-10-CM | POA: Diagnosis not present

## 2021-05-02 DIAGNOSIS — E119 Type 2 diabetes mellitus without complications: Secondary | ICD-10-CM | POA: Diagnosis not present

## 2021-05-02 DIAGNOSIS — E118 Type 2 diabetes mellitus with unspecified complications: Secondary | ICD-10-CM | POA: Diagnosis not present

## 2021-05-02 DIAGNOSIS — N189 Chronic kidney disease, unspecified: Secondary | ICD-10-CM | POA: Diagnosis not present

## 2021-05-02 DIAGNOSIS — E7849 Other hyperlipidemia: Secondary | ICD-10-CM | POA: Diagnosis not present

## 2021-05-02 DIAGNOSIS — M7062 Trochanteric bursitis, left hip: Secondary | ICD-10-CM | POA: Diagnosis not present

## 2021-05-02 DIAGNOSIS — R1310 Dysphagia, unspecified: Secondary | ICD-10-CM | POA: Diagnosis not present

## 2021-06-26 ENCOUNTER — Other Ambulatory Visit (HOSPITAL_COMMUNITY): Payer: Self-pay | Admitting: General Practice

## 2021-06-26 ENCOUNTER — Other Ambulatory Visit: Payer: Self-pay

## 2021-06-26 ENCOUNTER — Ambulatory Visit (HOSPITAL_COMMUNITY)
Admission: RE | Admit: 2021-06-26 | Discharge: 2021-06-26 | Disposition: A | Discharge status: Home / Self Care | Payer: PPO | Source: Ambulatory Visit | Attending: General Practice | Admitting: General Practice

## 2021-06-26 ENCOUNTER — Other Ambulatory Visit: Payer: Self-pay | Admitting: General Practice

## 2021-06-26 DIAGNOSIS — I82432 Acute embolism and thrombosis of left popliteal vein: Secondary | ICD-10-CM | POA: Diagnosis not present

## 2021-06-26 DIAGNOSIS — M7989 Other specified soft tissue disorders: Secondary | ICD-10-CM

## 2021-06-26 DIAGNOSIS — Z6835 Body mass index (BMI) 35.0-35.9, adult: Secondary | ICD-10-CM | POA: Diagnosis not present

## 2021-07-24 DIAGNOSIS — R5382 Chronic fatigue, unspecified: Secondary | ICD-10-CM | POA: Diagnosis not present

## 2021-07-24 DIAGNOSIS — E118 Type 2 diabetes mellitus with unspecified complications: Secondary | ICD-10-CM | POA: Diagnosis not present

## 2021-07-24 DIAGNOSIS — E782 Mixed hyperlipidemia: Secondary | ICD-10-CM | POA: Diagnosis not present

## 2021-07-24 DIAGNOSIS — E7849 Other hyperlipidemia: Secondary | ICD-10-CM | POA: Diagnosis not present

## 2021-07-24 DIAGNOSIS — I10 Essential (primary) hypertension: Secondary | ICD-10-CM | POA: Diagnosis not present

## 2021-07-24 DIAGNOSIS — E7800 Pure hypercholesterolemia, unspecified: Secondary | ICD-10-CM | POA: Diagnosis not present

## 2021-07-24 DIAGNOSIS — E7801 Familial hypercholesterolemia: Secondary | ICD-10-CM | POA: Diagnosis not present

## 2021-07-24 DIAGNOSIS — N189 Chronic kidney disease, unspecified: Secondary | ICD-10-CM | POA: Diagnosis not present

## 2021-08-01 DIAGNOSIS — F321 Major depressive disorder, single episode, moderate: Secondary | ICD-10-CM | POA: Diagnosis not present

## 2021-08-01 DIAGNOSIS — M7061 Trochanteric bursitis, right hip: Secondary | ICD-10-CM | POA: Diagnosis not present

## 2021-08-01 DIAGNOSIS — E7849 Other hyperlipidemia: Secondary | ICD-10-CM | POA: Diagnosis not present

## 2021-08-01 DIAGNOSIS — E119 Type 2 diabetes mellitus without complications: Secondary | ICD-10-CM | POA: Diagnosis not present

## 2021-08-01 DIAGNOSIS — N189 Chronic kidney disease, unspecified: Secondary | ICD-10-CM | POA: Diagnosis not present

## 2021-08-01 DIAGNOSIS — M7062 Trochanteric bursitis, left hip: Secondary | ICD-10-CM | POA: Diagnosis not present

## 2021-08-01 DIAGNOSIS — I10 Essential (primary) hypertension: Secondary | ICD-10-CM | POA: Diagnosis not present

## 2021-08-01 DIAGNOSIS — R1310 Dysphagia, unspecified: Secondary | ICD-10-CM | POA: Diagnosis not present

## 2021-08-27 DIAGNOSIS — S20212A Contusion of left front wall of thorax, initial encounter: Secondary | ICD-10-CM | POA: Diagnosis not present

## 2021-08-27 DIAGNOSIS — Z6834 Body mass index (BMI) 34.0-34.9, adult: Secondary | ICD-10-CM | POA: Diagnosis not present

## 2021-08-27 DIAGNOSIS — R296 Repeated falls: Secondary | ICD-10-CM | POA: Diagnosis not present

## 2021-08-27 DIAGNOSIS — S80212A Abrasion, left knee, initial encounter: Secondary | ICD-10-CM | POA: Diagnosis not present

## 2021-09-12 DIAGNOSIS — R55 Syncope and collapse: Secondary | ICD-10-CM | POA: Diagnosis not present

## 2021-10-08 ENCOUNTER — Encounter: Payer: Self-pay | Admitting: Cardiology

## 2021-10-08 ENCOUNTER — Ambulatory Visit (INDEPENDENT_AMBULATORY_CARE_PROVIDER_SITE_OTHER): Payer: PPO

## 2021-10-08 ENCOUNTER — Other Ambulatory Visit: Payer: Self-pay | Admitting: Cardiology

## 2021-10-08 ENCOUNTER — Ambulatory Visit: Payer: PPO | Admitting: Cardiology

## 2021-10-08 VITALS — BP 137/82 | HR 81 | Ht 68.0 in | Wt 223.8 lb

## 2021-10-08 DIAGNOSIS — E1140 Type 2 diabetes mellitus with diabetic neuropathy, unspecified: Secondary | ICD-10-CM

## 2021-10-08 DIAGNOSIS — R296 Repeated falls: Secondary | ICD-10-CM | POA: Diagnosis not present

## 2021-10-08 DIAGNOSIS — I10 Essential (primary) hypertension: Secondary | ICD-10-CM | POA: Diagnosis not present

## 2021-10-08 NOTE — Patient Instructions (Addendum)
Medication Instructions:  Your physician recommends that you continue on your current medications as directed. Please refer to the Current Medication list given to you today.  Labwork: none  Testing/Procedures: ZIO- Long Term Monitor Instructions   Your physician has requested you wear your ZIO patch monitor 7 days.   This is a single patch monitor.  [employer] supplies one patch monitor per enrollment.  Additional stickers are not available.   Please do not apply patch if you will be having a Nuclear Stress Test, Echocardiogram, Cardiac CT, MRI, or Chest Xray during the time frame you would be wearing the monitor. The patch cannot be worn during these tests.  You cannot remove and re-apply the ZIO XT patch monitor.     Once you have received you monitor, please review enclosed instructions.  Your monitor has already been registered assigning a specific monitor serial # to you.   Applying the monitor   Shave hair from upper left chest.   Hold abrader disc by orange tab.  Rub abrader in 40 strokes over left upper chest as indicated in your monitor instructions.   Clean area with 4 enclosed alcohol pads .  Use all pads to assure are is cleaned thoroughly.  Let dry.   Apply patch as indicated in monitor instructions.  Patch will be place under collarbone on left side of chest with arrow pointing upward.   Rub patch adhesive wings for 2 minutes.Remove white label marked "1".  Remove white label marked "2".  Rub patch adhesive wings for 2 additional minutes.   While looking in a mirror, press and release button in center of patch.  A small green light will flash 3-4 times .  This will be your only indicator the monitor has been turned on.     Do not shower for the first 24 hours.  You may shower after the first 24 hours.   Press button if you feel a symptom. You will hear a small click.  Record Date, Time and Symptom in the Patient Log Book.   When you are ready to remove patch, follow  instructions on last 2 pages of Patient Log Book.  Stick patch monitor onto last page of Patient Log Book.   Place Patient Log Book in [place] box.  Use locking tab on box and tape box closed securely.  The [employer] has [employer] on it.  Please place in mailbox as soon as possible.  Your physician should have your test results approximately 7 days after the monitor has been mailed back to [facility]   Call [place] at [phone] if you have questions regarding your ZIO XT patch monitor.  Call them immediately if you see an orange light blinking on your monitor.   If your monitor falls off in less than 4 days contact our Monitor department at [phone].  If your monitor becomes loose or falls off after 4 days call [employer] at [phone] for suggestions on securing your monitor.  Follow-Up: Your physician recommends that you schedule a follow-up appointment in: pending  Any Other Special Instructions Will Be Listed Below (If Applicable).  If you need a refill on your cardiac medications before your next appointment, please call your pharmacy.

## 2021-10-08 NOTE — Progress Notes (Signed)
Cardiology Office Note  Date: 10/08/2021   ID: Sarah Nguyen, DOB 12/12/1946, MRN [id number]  PCP:  [name], MD  Cardiologist:  [name], MD Electrophysiologist:  None   Chief Complaint  Patient presents with   History of falls     History of Present Illness: Sarah Nguyen is a 75 y.o. female referred for cardiology consultation by Dr. Pleas Koch for consideration of cardiac monitoring in the setting of recurrent falls.  She tells me that she was vacationing at [facility] back in September and had 3 falls in the span of 3 days.  [place] and states that she suddenly fell forward without obvious precipitant, did not lose consciousness.  When she got back up to walk back to her room she had another fall in same fashion.  On another day she was walking on sand and fell forward, again fairly suddenly but without loss of consciousness.  She does not describe any hip pain or buckling in her knees, no palpitations or chest pain, no sudden shortness of breath.  No prodrome of dizziness or headache.  This has not occurred since she has been back home.  She has no prior history of cardiac arrhythmias.  Recent echocardiogram done at [facility] indicated normal LVEF at 60 to 65%.  Orthostatic measurements were negative today.  I personally reviewed her ECG which shows sinus rhythm with leftward axis and borderline low voltage.  She was diagnosed with a right leg DVT in August 2022, has been on Xarelto since that time.  Past Medical History:  Diagnosis Date   Arthritis    Cancer [facility])    Depression    Essential hypertension    Hyperlipidemia    Right leg DVT [facility])    August 2022   Seasonal allergies    Type 2 diabetes mellitus ([place])     Past Surgical History:  Procedure Laterality Date   COLONOSCOPY W/ POLYPECTOMY     DILATION AND CURETTAGE OF UTERUS  1969   Ingrown toenail removal  75 years old   LUMBAR LAMINECTOMY WITH COFLEX 2 LEVEL N/A  03/26/2016   Procedure: Lumbar one-two Lumbar two -three Lumbar four-five Laminectomy with coflex;  Surgeon: Kristeen Miss, MD;  Location: [facility];  Service: Neurosurgery;  Laterality: N/A;  L1-2 L2-3 L4-5 Laminectomy with coflex   LUMBAR SPINE SURGERY  1992   TONSILLECTOMY  75 years old   VAGINAL HYSTERECTOMY  1975    Current Outpatient Medications  Medication Sig Dispense Refill   atorvastatin (LIPITOR) 20 MG tablet Take 20 mg by mouth at bedtime.      loratadine (CLARITIN) 10 MG tablet Take 10 mg by mouth at bedtime.      losartan-hydrochlorothiazide (HYZAAR) 50-12.5 MG tablet Take 1 tablet by mouth at bedtime.      metFORMIN (GLUCOPHAGE-XR) 500 MG 24 hr tablet Take 1,000 mg by mouth at bedtime.   1   metoprolol succinate (TOPROL-XL) 25 MG 24 hr tablet Take 25 mg by mouth at bedtime.   1   XARELTO 20 MG TABS tablet Take 20 mg by mouth daily.     No current facility-administered medications for this visit.   Allergies:  Ace inhibitors, Sulfa antibiotics, and Codeine   Social History: The patient  reports that she has never smoked. She has never used smokeless tobacco. She reports that she does not drink alcohol and does not use drugs.   Family History: The patient's family  history includes Arrhythmia in her mother; Cancer in her mother; Diabetes Mellitus II in her mother; Melanoma in her father; Stroke in her brother and mother.   ROS: Occasional dizziness, never any previous syncope, no known cardiac arrhythmias.  Physical Exam: VS:  BP 137/82 (BP Location: Right Arm, Cuff Size: Large)   Pulse 81   Ht 5\' 8"  (1.727 m)   Wt 223 lb 12.8 oz (101.5 kg)   SpO2 94%   BMI 34.03 kg/m , BMI Body mass index is 34.03 kg/m.  Wt Readings from Last 3 Encounters:  10/08/21 223 lb 12.8 oz (101.5 kg)  03/26/16 233 lb (105.7 kg)  03/18/16 233 lb 4.8 oz (105.8 kg)    General: Patient appears comfortable at rest. HEENT: Conjunctiva and lids normal, wearing a mask. Neck: Supple, no elevated  JVP or carotid bruits, no thyromegaly. Lungs: Clear to auscultation, nonlabored breathing at rest. Cardiac: Regular rate and rhythm, no S3 or significant systolic murmur, no pericardial rub. Abdomen: Soft, nontender, bowel sounds present. Extremities: No pitting edema, distal pulses 2+. Skin: Warm and dry. Musculoskeletal: No kyphosis. Neuropsychiatric: Alert and oriented x3, affect grossly appropriate.  ECG:  An ECG dated 09/20/2015 was personally reviewed today and demonstrated:  Sinus rhythm with leftward axis and decreased R wave progression.  Recent Labwork:  August 2022: Hemoglobin A1c 6.6%, BUN 24, creatinine 0.95, potassium 4.4, AST 14, ALT 16, cholesterol 180, triglycerides 145, HDL 58, LDL 97  Other Studies Reviewed Today:  Lexiscan Cardiolite 10/19/2015: There was no ST segment deviation noted during stress. The study is normal. There are no perfusion defects consistent with prior infarct or ischemia. This is a low risk study. The left ventricular ejection fraction is normal (55-65%).  Echocardiogram 09/12/2021 [facility]): Summary    1. Technically difficult study.    2. The left ventricle is normal in size with mildly increased wall  thickness.    3. The left ventricular systolic function is normal, LVEF is visually  estimated at 60-65%.    4. There is grade I diastolic dysfunction (impaired relaxation).    5. The left atrium is mildly dilated in size.    6. The right ventricle is normal in size, with normal systolic function.    7. There is insufficient TR signal pulmonary hypertension.   Assessment and Plan:  1.  Sequence of recurrent falls in September without obvious precipitant and no associated loss of consciousness.  These could have been mechanical falls.  Description is not consistent with a cardiac etiology.  Baseline ECG shows normal sinus rhythm and recent echocardiogram demonstrates normal LVEF with no major valvular abnormalities.  She was referred  for cardiac monitor, 7-day Zio patch will be arranged.  She was not orthostatic today.  2.  Essential hypertension, currently on Toprol-XL and Hyzaar with good blood pressure control today.  3.  Right leg DVT diagnosed in August 2022, continues on Xarelto per PCP.  4.  Type 2 diabetes mellitus, on Glucophage XR and Lipitor.  Reports some numbness in her feet, could have associated neuropathy which would increase her fall risk.  Medication Adjustments/Labs and Tests Ordered: Current medicines are reviewed at length with the patient today.  Concerns regarding medicines are outlined above.   Tests Ordered: Orders Placed This Encounter  Procedures   EKG 12-Lead     Medication Changes: No orders of the defined types were placed in this encounter.   Disposition:  Follow up  test results.  Signed, Satira Sark, MD, [facility]  10/08/2021 2:16 PM    [facility] [place], [place], [place] [address] Phone: [phone]; Fax: [phone]

## 2021-10-15 DIAGNOSIS — R296 Repeated falls: Secondary | ICD-10-CM

## 2021-10-23 DIAGNOSIS — R296 Repeated falls: Secondary | ICD-10-CM | POA: Diagnosis not present

## 2021-10-24 DIAGNOSIS — E782 Mixed hyperlipidemia: Secondary | ICD-10-CM | POA: Diagnosis not present

## 2021-10-24 DIAGNOSIS — N189 Chronic kidney disease, unspecified: Secondary | ICD-10-CM | POA: Diagnosis not present

## 2021-10-24 DIAGNOSIS — E7801 Familial hypercholesterolemia: Secondary | ICD-10-CM | POA: Diagnosis not present

## 2021-10-24 DIAGNOSIS — R5382 Chronic fatigue, unspecified: Secondary | ICD-10-CM | POA: Diagnosis not present

## 2021-10-24 DIAGNOSIS — R739 Hyperglycemia, unspecified: Secondary | ICD-10-CM | POA: Diagnosis not present

## 2021-10-24 DIAGNOSIS — E118 Type 2 diabetes mellitus with unspecified complications: Secondary | ICD-10-CM | POA: Diagnosis not present

## 2021-10-25 ENCOUNTER — Telehealth: Payer: Self-pay | Admitting: *Deleted

## 2021-10-25 NOTE — Telephone Encounter (Signed)
-----   Message from Satira Sark, MD sent at 10/24/2021 10:18 AM EST ----- Results reviewed.  Please let her know that the cardiac monitor showed normal rhythm and only rare ectopic beats which are commonly seen.  Nothing observed that would explain her falls from a cardiac perspective.  At this point would keep follow-up with PCP, no further cardiac testing anticipated.

## 2021-10-25 NOTE — Telephone Encounter (Signed)
   Pt is returning call, pt said can call her back tomorrow morning

## 2021-10-25 NOTE — Telephone Encounter (Signed)
Patient informed. Copy sent to PCP °

## 2021-10-30 DIAGNOSIS — E7849 Other hyperlipidemia: Secondary | ICD-10-CM | POA: Diagnosis not present

## 2021-10-30 DIAGNOSIS — M1990 Unspecified osteoarthritis, unspecified site: Secondary | ICD-10-CM | POA: Diagnosis not present

## 2021-10-30 DIAGNOSIS — M2550 Pain in unspecified joint: Secondary | ICD-10-CM | POA: Diagnosis not present

## 2021-10-30 DIAGNOSIS — I10 Essential (primary) hypertension: Secondary | ICD-10-CM | POA: Diagnosis not present

## 2021-10-30 DIAGNOSIS — E119 Type 2 diabetes mellitus without complications: Secondary | ICD-10-CM | POA: Diagnosis not present

## 2021-10-30 DIAGNOSIS — N189 Chronic kidney disease, unspecified: Secondary | ICD-10-CM | POA: Diagnosis not present

## 2021-10-30 DIAGNOSIS — R1310 Dysphagia, unspecified: Secondary | ICD-10-CM | POA: Diagnosis not present

## 2021-10-30 DIAGNOSIS — E118 Type 2 diabetes mellitus with unspecified complications: Secondary | ICD-10-CM | POA: Diagnosis not present

## 2021-11-21 ENCOUNTER — Other Ambulatory Visit: Payer: Self-pay

## 2021-11-21 DIAGNOSIS — I872 Venous insufficiency (chronic) (peripheral): Secondary | ICD-10-CM

## 2021-12-03 ENCOUNTER — Ambulatory Visit: Payer: PPO | Admitting: Physician Assistant

## 2021-12-03 ENCOUNTER — Other Ambulatory Visit: Payer: Self-pay

## 2021-12-03 ENCOUNTER — Ambulatory Visit (HOSPITAL_COMMUNITY)
Admission: RE | Admit: 2021-12-03 | Discharge: 2021-12-03 | Disposition: A | Discharge status: Home / Self Care | Payer: PPO | Source: Ambulatory Visit | Attending: Physician Assistant | Admitting: Physician Assistant

## 2021-12-03 VITALS — BP 123/77 | HR 73 | Temp 97.9°F | Resp 20 | Ht 68.0 in | Wt 224.0 lb

## 2021-12-03 DIAGNOSIS — I872 Venous insufficiency (chronic) (peripheral): Secondary | ICD-10-CM

## 2021-12-03 NOTE — Progress Notes (Signed)
Requested by:  [name], MD [place],  [place] [id number]  Reason for consultation: LLE swelling    History of Present Illness   Sarah Nguyen is a 75 y.o. ([birth year]) female who presents for evaluation of LLE swelling. She explains that a year ago she developed some thrombophlebitis in her RLE and was seen in ED for this. It resolved and she has not had any recurrent issues with her RLE. In July of this year she developed some swelling in her left ankle/ leg and was seen at PCP office. She was found to have DVT in her Popliteal and Gastroc veins. She was treated with 4 months of Xarelto. She denies any provoking factors such as trauma or injury, prolonged travel, sedentary activity, recent surgery, or hx of cancer. She explains that she never had any redness or pain associated with it. No family history of clotting disorders. Since then she has had intermittent swelling in the left ankle and foot. At times it seems worse then others. No aggravating or alleviating factors. She denies any aching, throbbing, heaviness, bleeding or ulceration. She explains sometimes that when she has significant swelling sometimes there is a burning sensation in the ankle. Her sister gave her a pair of OTC knee high compression stockings. She says she has only worn them very intermittently. She has seen no improvement. She does not elevate. She has no family history of DVT. She explains that her son had at DVT but this was after knee surgery. Her mother had venous disease.  Venous symptoms include: swelling Onset/duration:  6 months  Occupation:  retired, previously worked in [employer] Aggravating factors: (sitting, standing) Alleviating factors: (elevation) Compression:  knee high Helps:  no Pain medications:  none Previous vein procedures:  none History of DVT:  Popliteal and gastrocnemius veins July 2022  Past Medical History:  Diagnosis Date   Arthritis    Cancer [facility])    Depression    DVT  (deep venous thrombosis) (HCC)    Essential hypertension    Hyperlipidemia    Right leg DVT [facility])    August 2022   Seasonal allergies    Type 2 diabetes mellitus ([place])     Past Surgical History:  Procedure Laterality Date   COLONOSCOPY W/ POLYPECTOMY     DILATION AND CURETTAGE OF UTERUS  1969   Ingrown toenail removal  75 years old   LUMBAR LAMINECTOMY WITH COFLEX 2 LEVEL N/A 03/26/2016   Procedure: Lumbar one-two Lumbar two -three Lumbar four-five Laminectomy with coflex;  Surgeon: Kristeen Miss, MD;  Location: [facility];  Service: Neurosurgery;  Laterality: N/A;  L1-2 L2-3 L4-5 Laminectomy with coflex   LUMBAR SPINE SURGERY  1992   TONSILLECTOMY  75 years old   VAGINAL HYSTERECTOMY  1975    Social History   Socioeconomic History   Marital status: Divorced    Spouse name: Not on file   Number of children: Not on file   Years of education: Not on file   Highest education level: Not on file  Occupational History   Not on file  Tobacco Use   Smoking status: Never   Smokeless tobacco: Never  Vaping Use   Vaping Use: Never used  Substance and Sexual Activity   Alcohol use: No    Alcohol/week: 0.0 standard drinks   Drug use: No   Sexual activity: Not on file  Other Topics Concern   Not on file  Social History Narrative  Not on file   Social Determinants of Health   Financial Resource Strain: Not on file  Food Insecurity: Not on file  Transportation Needs: Not on file  Physical Activity: Not on file  Stress: Not on file  Social Connections: Not on file  Intimate Partner Violence: Not on file    Family History  Problem Relation Age of Onset   Diabetes Mellitus II Mother    Melanoma Father    Stroke Mother    Cancer Mother    Stroke Brother    Arrhythmia Mother     Current Outpatient Medications  Medication Sig Dispense Refill   atorvastatin (LIPITOR) 20 MG tablet Take 20 mg by mouth at bedtime.      loratadine (CLARITIN) 10 MG tablet Take 10 mg by mouth  at bedtime.      losartan-hydrochlorothiazide (HYZAAR) 50-12.5 MG tablet Take 1 tablet by mouth at bedtime.      metFORMIN (GLUCOPHAGE-XR) 500 MG 24 hr tablet Take 1,000 mg by mouth at bedtime.   1   metoprolol succinate (TOPROL-XL) 25 MG 24 hr tablet Take 25 mg by mouth at bedtime.   1   No current facility-administered medications for this visit.    Allergies  Allergen Reactions   Ace Inhibitors Cough   Sulfa Antibiotics Other (See Comments)    Unspecified   Codeine Itching    REVIEW OF SYSTEMS (negative unless checked):   Cardiac:  []  Chest pain or chest pressure? []  Shortness of breath upon activity? []  Shortness of breath when lying flat? []  Irregular heart rhythm?  Vascular:  []  Pain in calf, thigh, or hip brought on by walking? []  Pain in feet at night that wakes you up from your sleep? []  Blood clot in your veins? X Leg swelling?  Pulmonary:  []  Oxygen at home? []  Productive cough? []  Wheezing?  Neurologic:  []  Sudden weakness in arms or legs? []  Sudden numbness in arms or legs? []  Sudden onset of difficult speaking or slurred speech? []  Temporary loss of vision in one eye? []  Problems with dizziness?  Gastrointestinal:  []  Blood in stool? []  Vomited blood?  Genitourinary:  []  Burning when urinating? []  Blood in urine?  Psychiatric:  []  Major depression  Hematologic:  []  Bleeding problems? []  Problems with blood clotting?  Dermatologic:  []  Rashes or ulcers?  Constitutional:  []  Fever or chills?  Ear/Nose/Throat:  []  Change in hearing? []  Nose bleeds? []  Sore throat?  Musculoskeletal:  []  Back pain? []  Joint pain? []  Muscle pain?   Physical Examination     Vitals:   12/03/21 0932  BP: 123/77  Pulse: 73  Resp: 20  Temp: 97.9 F (36.6 C)  SpO2: 95%  Weight: 224 lb (101.6 kg)  Height: 5\' 8"  (1.727 m)   Body mass index is 34.06 kg/m.  General:  WDWN in NAD; vital signs documented above Gait: Normal HENT: WNL,  normocephalic Pulmonary: normal non-labored breathing , without wheezing Cardiac: regular HR, without  Murmurs without carotid bruit Vascular Exam/Pulses:2+ radial, 2+ pedal pulses bilaterally Extremities: without varicose veins, with reticular veins, with mild edema of left greater than right ankle, without stasis pigmentation, without lipodermatosclerosis, without ulcers Musculoskeletal: no muscle wasting or atrophy  Neurologic: A&O X 3;  No focal weakness or paresthesias are detected Psychiatric:  The pt has Normal affect.  Non-invasive Vascular Imaging   BLE Venous Insufficiency Duplex (12/03/21):  LLE: No DVT and SVT GSV reflux Proximal thigh to distal thigh GSV diameter 0.36-0.60 No  SSV reflux  Popliteal deep venous reflux   Medical Decision Making   Sarah Nguyen is a 75 y.o. female who presents with: LLE chronic venous insufficiency with swelling. Her duplex shows no DVT or SVT. She does have deep reflux in her popliteal vein. This is likely secondary to having previous DVT of her popliteal vein. She has superficial reflux in her GSV. No SSV reflux. Her GSV is of adequate size to be considered for venous lazer ablation.  Based on the patient's history and examination, I recommend daily elevation above level of her heart, thigh high compression stockings, exercise, and refraining from prolonged sitting or standing.  I discussed with the patient the use of her 20-30 mm thigh high compression stockings and need for 3 month trial of such. The patient will follow up in 3 months with MD to be evaluated for possible venous lazer ablation Thank you for allowing [place] to participate in this patient's care.   Karoline Caldwell, PA-C [facility] Office: [phone]  12/03/2021, 10:26 AM  Clinic MD: Dr. Scot Dock

## 2022-01-02 DIAGNOSIS — Z1283 Encounter for screening for malignant neoplasm of skin: Secondary | ICD-10-CM | POA: Diagnosis not present

## 2022-01-02 DIAGNOSIS — L821 Other seborrheic keratosis: Secondary | ICD-10-CM | POA: Diagnosis not present

## 2022-01-02 DIAGNOSIS — L570 Actinic keratosis: Secondary | ICD-10-CM | POA: Diagnosis not present

## 2022-01-24 DIAGNOSIS — E7849 Other hyperlipidemia: Secondary | ICD-10-CM | POA: Diagnosis not present

## 2022-01-24 DIAGNOSIS — E782 Mixed hyperlipidemia: Secondary | ICD-10-CM | POA: Diagnosis not present

## 2022-01-24 DIAGNOSIS — I10 Essential (primary) hypertension: Secondary | ICD-10-CM | POA: Diagnosis not present

## 2022-01-24 DIAGNOSIS — N189 Chronic kidney disease, unspecified: Secondary | ICD-10-CM | POA: Diagnosis not present

## 2022-01-24 DIAGNOSIS — R5382 Chronic fatigue, unspecified: Secondary | ICD-10-CM | POA: Diagnosis not present

## 2022-01-24 DIAGNOSIS — E118 Type 2 diabetes mellitus with unspecified complications: Secondary | ICD-10-CM | POA: Diagnosis not present

## 2022-01-31 ENCOUNTER — Other Ambulatory Visit (HOSPITAL_COMMUNITY): Payer: Self-pay | Admitting: General Practice

## 2022-01-31 ENCOUNTER — Other Ambulatory Visit (HOSPITAL_COMMUNITY): Payer: Self-pay | Admitting: Family Medicine

## 2022-01-31 DIAGNOSIS — Z1231 Encounter for screening mammogram for malignant neoplasm of breast: Secondary | ICD-10-CM

## 2022-02-06 DIAGNOSIS — R1310 Dysphagia, unspecified: Secondary | ICD-10-CM | POA: Diagnosis not present

## 2022-02-06 DIAGNOSIS — Z1389 Encounter for screening for other disorder: Secondary | ICD-10-CM | POA: Diagnosis not present

## 2022-02-06 DIAGNOSIS — E118 Type 2 diabetes mellitus with unspecified complications: Secondary | ICD-10-CM | POA: Diagnosis not present

## 2022-02-06 DIAGNOSIS — Z1331 Encounter for screening for depression: Secondary | ICD-10-CM | POA: Diagnosis not present

## 2022-02-06 DIAGNOSIS — E119 Type 2 diabetes mellitus without complications: Secondary | ICD-10-CM | POA: Diagnosis not present

## 2022-02-06 DIAGNOSIS — M7061 Trochanteric bursitis, right hip: Secondary | ICD-10-CM | POA: Diagnosis not present

## 2022-02-06 DIAGNOSIS — I10 Essential (primary) hypertension: Secondary | ICD-10-CM | POA: Diagnosis not present

## 2022-02-06 DIAGNOSIS — M7062 Trochanteric bursitis, left hip: Secondary | ICD-10-CM | POA: Diagnosis not present

## 2022-02-13 DIAGNOSIS — R509 Fever, unspecified: Secondary | ICD-10-CM | POA: Diagnosis not present

## 2022-02-13 DIAGNOSIS — M7061 Trochanteric bursitis, right hip: Secondary | ICD-10-CM | POA: Diagnosis not present

## 2022-02-13 DIAGNOSIS — Z20828 Contact with and (suspected) exposure to other viral communicable diseases: Secondary | ICD-10-CM | POA: Diagnosis not present

## 2022-02-13 DIAGNOSIS — Z6835 Body mass index (BMI) 35.0-35.9, adult: Secondary | ICD-10-CM | POA: Diagnosis not present

## 2022-02-19 DIAGNOSIS — M7061 Trochanteric bursitis, right hip: Secondary | ICD-10-CM | POA: Diagnosis not present

## 2022-02-19 DIAGNOSIS — M7062 Trochanteric bursitis, left hip: Secondary | ICD-10-CM | POA: Diagnosis not present

## 2022-02-27 ENCOUNTER — Other Ambulatory Visit: Payer: Self-pay

## 2022-02-27 ENCOUNTER — Ambulatory Visit (HOSPITAL_COMMUNITY)
Admission: RE | Admit: 2022-02-27 | Discharge: 2022-02-27 | Disposition: A | Discharge status: Home / Self Care | Payer: PPO | Source: Ambulatory Visit | Attending: General Practice | Admitting: General Practice

## 2022-02-27 DIAGNOSIS — Z1231 Encounter for screening mammogram for malignant neoplasm of breast: Secondary | ICD-10-CM | POA: Diagnosis not present

## 2022-03-04 ENCOUNTER — Ambulatory Visit: Payer: PPO | Admitting: Surgery

## 2022-03-06 ENCOUNTER — Other Ambulatory Visit (HOSPITAL_COMMUNITY): Payer: Self-pay | Admitting: General Practice

## 2022-03-06 DIAGNOSIS — R928 Other abnormal and inconclusive findings on diagnostic imaging of breast: Secondary | ICD-10-CM

## 2022-03-11 DIAGNOSIS — M5117 Intervertebral disc disorders with radiculopathy, lumbosacral region: Secondary | ICD-10-CM | POA: Diagnosis not present

## 2022-03-11 DIAGNOSIS — M5416 Radiculopathy, lumbar region: Secondary | ICD-10-CM | POA: Diagnosis not present

## 2022-03-18 ENCOUNTER — Ambulatory Visit: Payer: PPO | Admitting: Surgery

## 2022-03-19 DIAGNOSIS — M25551 Pain in right hip: Secondary | ICD-10-CM | POA: Diagnosis not present

## 2022-03-19 DIAGNOSIS — Z6834 Body mass index (BMI) 34.0-34.9, adult: Secondary | ICD-10-CM | POA: Diagnosis not present

## 2022-03-19 DIAGNOSIS — M25552 Pain in left hip: Secondary | ICD-10-CM | POA: Diagnosis not present

## 2022-03-19 DIAGNOSIS — I10 Essential (primary) hypertension: Secondary | ICD-10-CM | POA: Diagnosis not present

## 2022-04-03 ENCOUNTER — Ambulatory Visit (HOSPITAL_COMMUNITY)
Admission: RE | Admit: 2022-04-03 | Discharge: 2022-04-03 | Disposition: A | Discharge status: Home / Self Care | Payer: PPO | Source: Ambulatory Visit | Attending: General Practice | Admitting: General Practice

## 2022-04-03 DIAGNOSIS — R928 Other abnormal and inconclusive findings on diagnostic imaging of breast: Secondary | ICD-10-CM | POA: Insufficient documentation

## 2022-04-03 DIAGNOSIS — N6002 Solitary cyst of left breast: Secondary | ICD-10-CM | POA: Diagnosis not present

## 2022-04-08 DIAGNOSIS — M7061 Trochanteric bursitis, right hip: Secondary | ICD-10-CM | POA: Diagnosis not present

## 2022-04-08 DIAGNOSIS — M7062 Trochanteric bursitis, left hip: Secondary | ICD-10-CM | POA: Diagnosis not present

## 2022-04-15 DIAGNOSIS — M7062 Trochanteric bursitis, left hip: Secondary | ICD-10-CM | POA: Diagnosis not present

## 2022-04-15 DIAGNOSIS — M7061 Trochanteric bursitis, right hip: Secondary | ICD-10-CM | POA: Diagnosis not present

## 2022-04-25 DIAGNOSIS — R5382 Chronic fatigue, unspecified: Secondary | ICD-10-CM | POA: Diagnosis not present

## 2022-04-25 DIAGNOSIS — E782 Mixed hyperlipidemia: Secondary | ICD-10-CM | POA: Diagnosis not present

## 2022-04-25 DIAGNOSIS — Z7689 Persons encountering health services in other specified circumstances: Secondary | ICD-10-CM | POA: Diagnosis not present

## 2022-04-25 DIAGNOSIS — E7849 Other hyperlipidemia: Secondary | ICD-10-CM | POA: Diagnosis not present

## 2022-04-25 DIAGNOSIS — E119 Type 2 diabetes mellitus without complications: Secondary | ICD-10-CM | POA: Diagnosis not present

## 2022-04-25 DIAGNOSIS — N189 Chronic kidney disease, unspecified: Secondary | ICD-10-CM | POA: Diagnosis not present

## 2022-04-25 DIAGNOSIS — I10 Essential (primary) hypertension: Secondary | ICD-10-CM | POA: Diagnosis not present

## 2022-04-30 DIAGNOSIS — M7061 Trochanteric bursitis, right hip: Secondary | ICD-10-CM | POA: Diagnosis not present

## 2022-04-30 DIAGNOSIS — E7849 Other hyperlipidemia: Secondary | ICD-10-CM | POA: Diagnosis not present

## 2022-04-30 DIAGNOSIS — N189 Chronic kidney disease, unspecified: Secondary | ICD-10-CM | POA: Diagnosis not present

## 2022-04-30 DIAGNOSIS — F321 Major depressive disorder, single episode, moderate: Secondary | ICD-10-CM | POA: Diagnosis not present

## 2022-04-30 DIAGNOSIS — M2550 Pain in unspecified joint: Secondary | ICD-10-CM | POA: Diagnosis not present

## 2022-04-30 DIAGNOSIS — E782 Mixed hyperlipidemia: Secondary | ICD-10-CM | POA: Diagnosis not present

## 2022-04-30 DIAGNOSIS — E119 Type 2 diabetes mellitus without complications: Secondary | ICD-10-CM | POA: Diagnosis not present

## 2022-04-30 DIAGNOSIS — M7062 Trochanteric bursitis, left hip: Secondary | ICD-10-CM | POA: Diagnosis not present

## 2022-04-30 DIAGNOSIS — E118 Type 2 diabetes mellitus with unspecified complications: Secondary | ICD-10-CM | POA: Diagnosis not present

## 2022-04-30 DIAGNOSIS — R1310 Dysphagia, unspecified: Secondary | ICD-10-CM | POA: Diagnosis not present

## 2022-04-30 DIAGNOSIS — I10 Essential (primary) hypertension: Secondary | ICD-10-CM | POA: Diagnosis not present

## 2022-04-30 DIAGNOSIS — M1990 Unspecified osteoarthritis, unspecified site: Secondary | ICD-10-CM | POA: Diagnosis not present

## 2022-04-30 DIAGNOSIS — Z6833 Body mass index (BMI) 33.0-33.9, adult: Secondary | ICD-10-CM | POA: Diagnosis not present

## 2022-05-06 DIAGNOSIS — M47817 Spondylosis without myelopathy or radiculopathy, lumbosacral region: Secondary | ICD-10-CM | POA: Diagnosis not present

## 2022-05-06 DIAGNOSIS — M25559 Pain in unspecified hip: Secondary | ICD-10-CM | POA: Diagnosis not present

## 2022-05-06 DIAGNOSIS — Z9889 Other specified postprocedural states: Secondary | ICD-10-CM | POA: Diagnosis not present

## 2022-05-06 DIAGNOSIS — M7061 Trochanteric bursitis, right hip: Secondary | ICD-10-CM | POA: Diagnosis not present

## 2022-05-15 DIAGNOSIS — M7061 Trochanteric bursitis, right hip: Secondary | ICD-10-CM | POA: Diagnosis not present

## 2022-05-15 DIAGNOSIS — M7062 Trochanteric bursitis, left hip: Secondary | ICD-10-CM | POA: Diagnosis not present

## 2022-05-30 DIAGNOSIS — Z7984 Long term (current) use of oral hypoglycemic drugs: Secondary | ICD-10-CM | POA: Diagnosis not present

## 2022-05-30 DIAGNOSIS — Z961 Presence of intraocular lens: Secondary | ICD-10-CM | POA: Diagnosis not present

## 2022-05-30 DIAGNOSIS — E119 Type 2 diabetes mellitus without complications: Secondary | ICD-10-CM | POA: Diagnosis not present

## 2022-06-12 DIAGNOSIS — M48062 Spinal stenosis, lumbar region with neurogenic claudication: Secondary | ICD-10-CM | POA: Diagnosis not present

## 2022-06-12 DIAGNOSIS — M5416 Radiculopathy, lumbar region: Secondary | ICD-10-CM | POA: Diagnosis not present

## 2022-06-17 DIAGNOSIS — M47817 Spondylosis without myelopathy or radiculopathy, lumbosacral region: Secondary | ICD-10-CM | POA: Diagnosis not present

## 2022-06-17 DIAGNOSIS — M25559 Pain in unspecified hip: Secondary | ICD-10-CM | POA: Diagnosis not present

## 2022-06-19 DIAGNOSIS — M545 Low back pain, unspecified: Secondary | ICD-10-CM | POA: Diagnosis not present

## 2022-06-19 DIAGNOSIS — M5416 Radiculopathy, lumbar region: Secondary | ICD-10-CM | POA: Diagnosis not present

## 2022-06-26 DIAGNOSIS — M4156 Other secondary scoliosis, lumbar region: Secondary | ICD-10-CM | POA: Diagnosis not present

## 2022-06-26 DIAGNOSIS — Z6832 Body mass index (BMI) 32.0-32.9, adult: Secondary | ICD-10-CM | POA: Diagnosis not present

## 2022-07-23 DIAGNOSIS — E118 Type 2 diabetes mellitus with unspecified complications: Secondary | ICD-10-CM | POA: Diagnosis not present

## 2022-07-23 DIAGNOSIS — E7801 Familial hypercholesterolemia: Secondary | ICD-10-CM | POA: Diagnosis not present

## 2022-07-23 DIAGNOSIS — E7849 Other hyperlipidemia: Secondary | ICD-10-CM | POA: Diagnosis not present

## 2022-07-23 DIAGNOSIS — N189 Chronic kidney disease, unspecified: Secondary | ICD-10-CM | POA: Diagnosis not present

## 2022-07-23 DIAGNOSIS — R739 Hyperglycemia, unspecified: Secondary | ICD-10-CM | POA: Diagnosis not present

## 2022-08-05 DIAGNOSIS — I10 Essential (primary) hypertension: Secondary | ICD-10-CM | POA: Diagnosis not present

## 2022-08-05 DIAGNOSIS — E7849 Other hyperlipidemia: Secondary | ICD-10-CM | POA: Diagnosis not present

## 2022-08-05 DIAGNOSIS — F321 Major depressive disorder, single episode, moderate: Secondary | ICD-10-CM | POA: Diagnosis not present

## 2022-08-05 DIAGNOSIS — D692 Other nonthrombocytopenic purpura: Secondary | ICD-10-CM | POA: Diagnosis not present

## 2022-08-05 DIAGNOSIS — R1310 Dysphagia, unspecified: Secondary | ICD-10-CM | POA: Diagnosis not present

## 2022-08-05 DIAGNOSIS — N189 Chronic kidney disease, unspecified: Secondary | ICD-10-CM | POA: Diagnosis not present

## 2022-08-05 DIAGNOSIS — Z6833 Body mass index (BMI) 33.0-33.9, adult: Secondary | ICD-10-CM | POA: Diagnosis not present

## 2022-08-05 DIAGNOSIS — E118 Type 2 diabetes mellitus with unspecified complications: Secondary | ICD-10-CM | POA: Diagnosis not present

## 2022-08-05 DIAGNOSIS — E119 Type 2 diabetes mellitus without complications: Secondary | ICD-10-CM | POA: Diagnosis not present

## 2022-08-05 DIAGNOSIS — M1990 Unspecified osteoarthritis, unspecified site: Secondary | ICD-10-CM | POA: Diagnosis not present

## 2022-08-05 DIAGNOSIS — M2550 Pain in unspecified joint: Secondary | ICD-10-CM | POA: Diagnosis not present

## 2022-09-14 DIAGNOSIS — I10 Essential (primary) hypertension: Secondary | ICD-10-CM | POA: Diagnosis not present

## 2022-09-14 DIAGNOSIS — E782 Mixed hyperlipidemia: Secondary | ICD-10-CM | POA: Diagnosis not present

## 2022-09-14 DIAGNOSIS — E119 Type 2 diabetes mellitus without complications: Secondary | ICD-10-CM | POA: Diagnosis not present

## 2022-09-20 DIAGNOSIS — Z23 Encounter for immunization: Secondary | ICD-10-CM | POA: Diagnosis not present

## 2022-11-01 DIAGNOSIS — I10 Essential (primary) hypertension: Secondary | ICD-10-CM | POA: Diagnosis not present

## 2022-11-01 DIAGNOSIS — E7849 Other hyperlipidemia: Secondary | ICD-10-CM | POA: Diagnosis not present

## 2022-11-01 DIAGNOSIS — R5382 Chronic fatigue, unspecified: Secondary | ICD-10-CM | POA: Diagnosis not present

## 2022-11-01 DIAGNOSIS — E118 Type 2 diabetes mellitus with unspecified complications: Secondary | ICD-10-CM | POA: Diagnosis not present

## 2022-11-01 DIAGNOSIS — N189 Chronic kidney disease, unspecified: Secondary | ICD-10-CM | POA: Diagnosis not present

## 2022-11-05 DIAGNOSIS — D692 Other nonthrombocytopenic purpura: Secondary | ICD-10-CM | POA: Diagnosis not present

## 2022-11-05 DIAGNOSIS — E7849 Other hyperlipidemia: Secondary | ICD-10-CM | POA: Diagnosis not present

## 2022-11-05 DIAGNOSIS — M1990 Unspecified osteoarthritis, unspecified site: Secondary | ICD-10-CM | POA: Diagnosis not present

## 2022-11-05 DIAGNOSIS — I10 Essential (primary) hypertension: Secondary | ICD-10-CM | POA: Diagnosis not present

## 2022-11-05 DIAGNOSIS — F321 Major depressive disorder, single episode, moderate: Secondary | ICD-10-CM | POA: Diagnosis not present

## 2022-11-05 DIAGNOSIS — E119 Type 2 diabetes mellitus without complications: Secondary | ICD-10-CM | POA: Diagnosis not present

## 2022-11-05 DIAGNOSIS — E118 Type 2 diabetes mellitus with unspecified complications: Secondary | ICD-10-CM | POA: Diagnosis not present

## 2022-11-05 DIAGNOSIS — R1310 Dysphagia, unspecified: Secondary | ICD-10-CM | POA: Diagnosis not present

## 2022-11-05 DIAGNOSIS — N189 Chronic kidney disease, unspecified: Secondary | ICD-10-CM | POA: Diagnosis not present

## 2022-11-05 DIAGNOSIS — M2550 Pain in unspecified joint: Secondary | ICD-10-CM | POA: Diagnosis not present

## 2022-11-28 DIAGNOSIS — M1812 Unilateral primary osteoarthritis of first carpometacarpal joint, left hand: Secondary | ICD-10-CM | POA: Diagnosis not present

## 2022-12-27 DIAGNOSIS — M48062 Spinal stenosis, lumbar region with neurogenic claudication: Secondary | ICD-10-CM | POA: Diagnosis not present

## 2023-01-17 DIAGNOSIS — E7801 Familial hypercholesterolemia: Secondary | ICD-10-CM | POA: Diagnosis not present

## 2023-01-17 DIAGNOSIS — Z0001 Encounter for general adult medical examination with abnormal findings: Secondary | ICD-10-CM | POA: Diagnosis not present

## 2023-01-17 DIAGNOSIS — R739 Hyperglycemia, unspecified: Secondary | ICD-10-CM | POA: Diagnosis not present

## 2023-01-17 DIAGNOSIS — R5383 Other fatigue: Secondary | ICD-10-CM | POA: Diagnosis not present

## 2023-01-17 DIAGNOSIS — Z6833 Body mass index (BMI) 33.0-33.9, adult: Secondary | ICD-10-CM | POA: Diagnosis not present

## 2023-01-17 DIAGNOSIS — N189 Chronic kidney disease, unspecified: Secondary | ICD-10-CM | POA: Diagnosis not present

## 2023-01-17 DIAGNOSIS — E7849 Other hyperlipidemia: Secondary | ICD-10-CM | POA: Diagnosis not present

## 2023-01-17 DIAGNOSIS — E118 Type 2 diabetes mellitus with unspecified complications: Secondary | ICD-10-CM | POA: Diagnosis not present

## 2023-01-17 DIAGNOSIS — I10 Essential (primary) hypertension: Secondary | ICD-10-CM | POA: Diagnosis not present

## 2023-01-21 ENCOUNTER — Other Ambulatory Visit (HOSPITAL_COMMUNITY): Payer: Self-pay | Admitting: General Practice

## 2023-01-21 DIAGNOSIS — R1310 Dysphagia, unspecified: Secondary | ICD-10-CM | POA: Diagnosis not present

## 2023-01-21 DIAGNOSIS — M2550 Pain in unspecified joint: Secondary | ICD-10-CM | POA: Diagnosis not present

## 2023-01-21 DIAGNOSIS — E118 Type 2 diabetes mellitus with unspecified complications: Secondary | ICD-10-CM | POA: Diagnosis not present

## 2023-01-21 DIAGNOSIS — Z1231 Encounter for screening mammogram for malignant neoplasm of breast: Secondary | ICD-10-CM

## 2023-01-21 DIAGNOSIS — E119 Type 2 diabetes mellitus without complications: Secondary | ICD-10-CM | POA: Diagnosis not present

## 2023-01-21 DIAGNOSIS — D692 Other nonthrombocytopenic purpura: Secondary | ICD-10-CM | POA: Diagnosis not present

## 2023-01-21 DIAGNOSIS — N189 Chronic kidney disease, unspecified: Secondary | ICD-10-CM | POA: Diagnosis not present

## 2023-01-21 DIAGNOSIS — Z6833 Body mass index (BMI) 33.0-33.9, adult: Secondary | ICD-10-CM | POA: Diagnosis not present

## 2023-01-21 DIAGNOSIS — I10 Essential (primary) hypertension: Secondary | ICD-10-CM | POA: Diagnosis not present

## 2023-01-21 DIAGNOSIS — E7849 Other hyperlipidemia: Secondary | ICD-10-CM | POA: Diagnosis not present

## 2023-01-21 DIAGNOSIS — M1990 Unspecified osteoarthritis, unspecified site: Secondary | ICD-10-CM | POA: Diagnosis not present

## 2023-01-21 DIAGNOSIS — F321 Major depressive disorder, single episode, moderate: Secondary | ICD-10-CM | POA: Diagnosis not present

## 2023-03-03 ENCOUNTER — Ambulatory Visit (HOSPITAL_COMMUNITY): Payer: PPO

## 2023-03-07 ENCOUNTER — Ambulatory Visit (HOSPITAL_COMMUNITY)
Admission: RE | Admit: 2023-03-07 | Discharge: 2023-03-07 | Disposition: A | Discharge status: Home / Self Care | Payer: PPO | Source: Ambulatory Visit | Attending: General Practice | Admitting: General Practice

## 2023-03-07 DIAGNOSIS — Z1231 Encounter for screening mammogram for malignant neoplasm of breast: Secondary | ICD-10-CM | POA: Diagnosis not present

## 2023-03-28 DIAGNOSIS — Z6833 Body mass index (BMI) 33.0-33.9, adult: Secondary | ICD-10-CM | POA: Diagnosis not present

## 2023-03-28 DIAGNOSIS — R059 Cough, unspecified: Secondary | ICD-10-CM | POA: Diagnosis not present

## 2023-03-28 DIAGNOSIS — J0100 Acute maxillary sinusitis, unspecified: Secondary | ICD-10-CM | POA: Diagnosis not present

## 2023-03-29 IMAGING — US US EXTREM LOW VENOUS*L*
1 series · 13 of 24 positions shown · non-contrast
Comparison: None.

CLINICAL DATA: 75-year-old female with left lower extremity
swelling



[Series 1: us venous img lower uni left (dvt) · portal-venous · 13 of 48 slices shown]
[im 1/48]
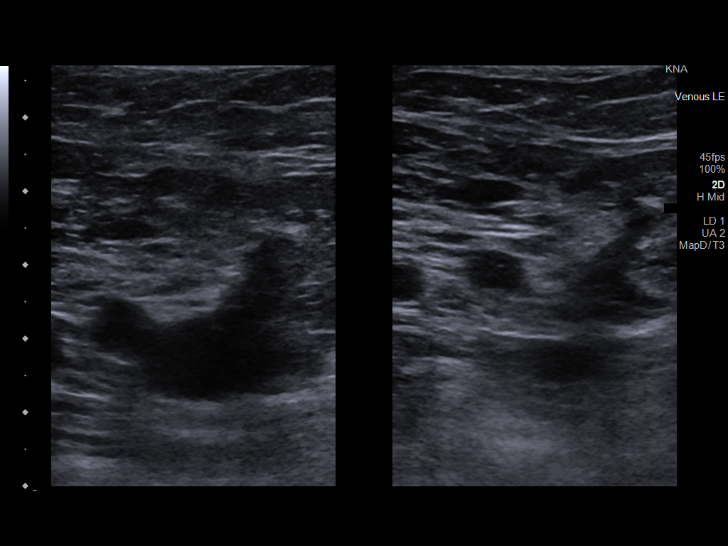
[im 5/48]
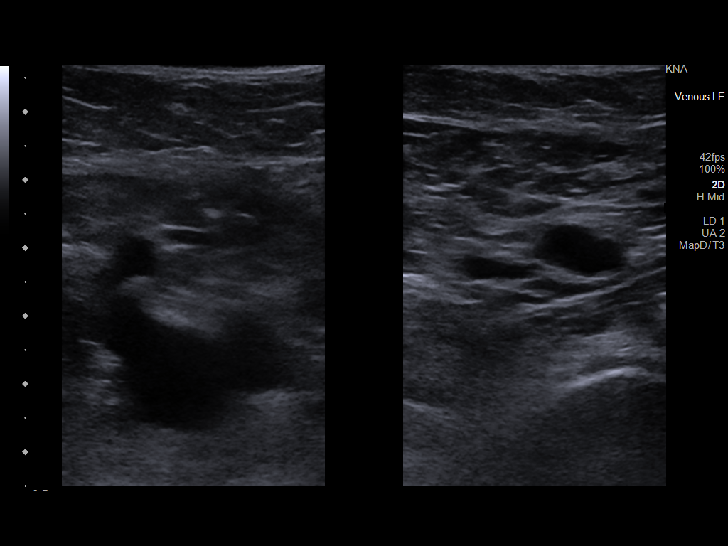
[im 9/48]
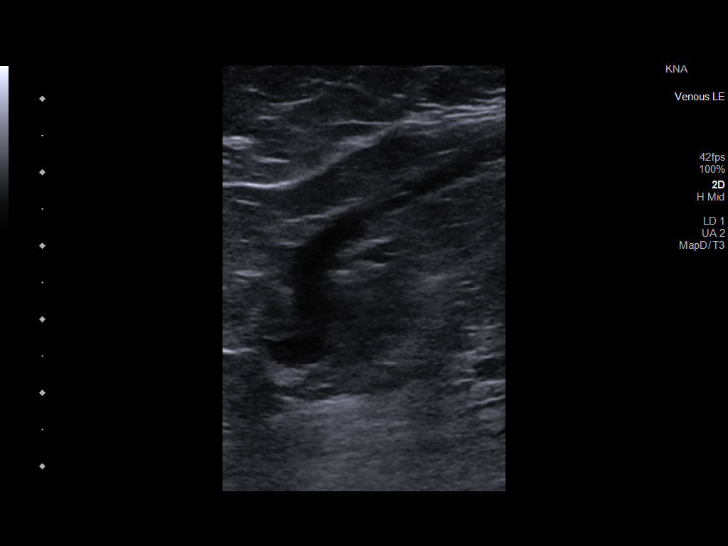
[im 13/48]
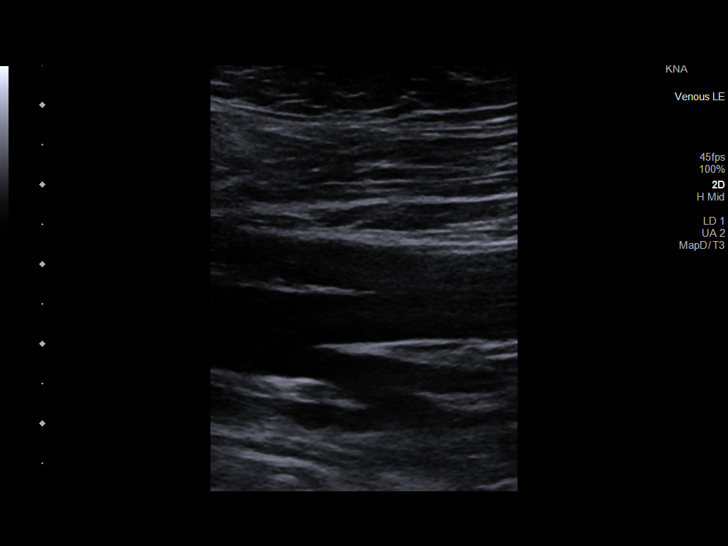
[im 17/48]
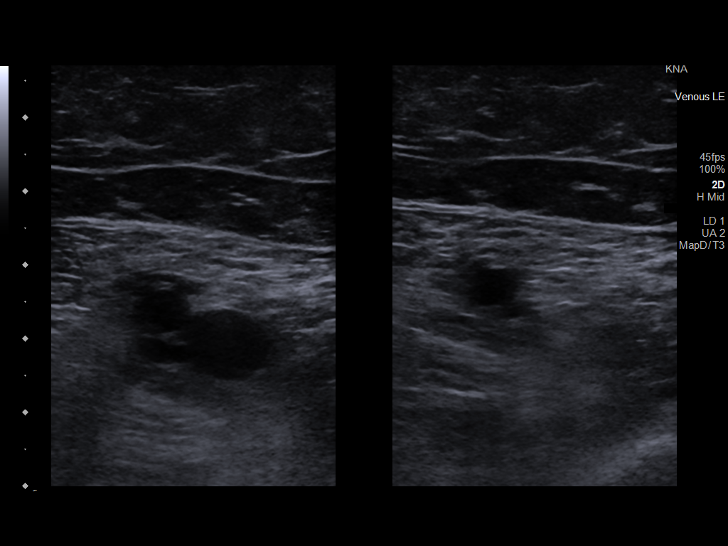
[im 21/48]
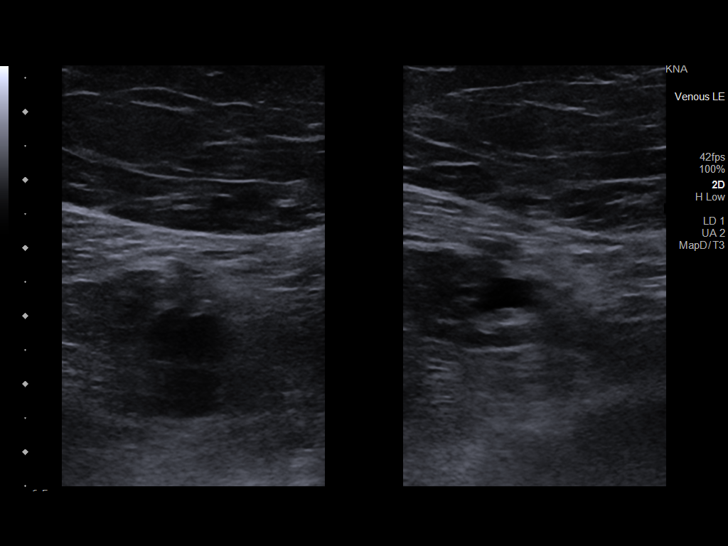
[im 25/48]
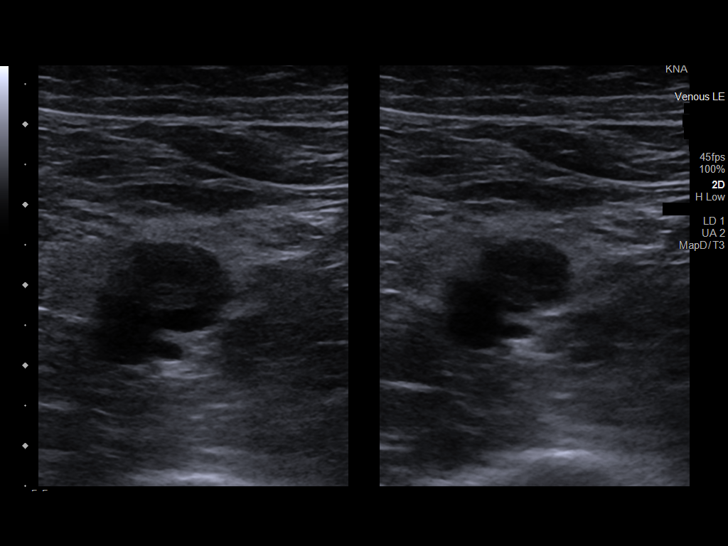
[im 27/48]
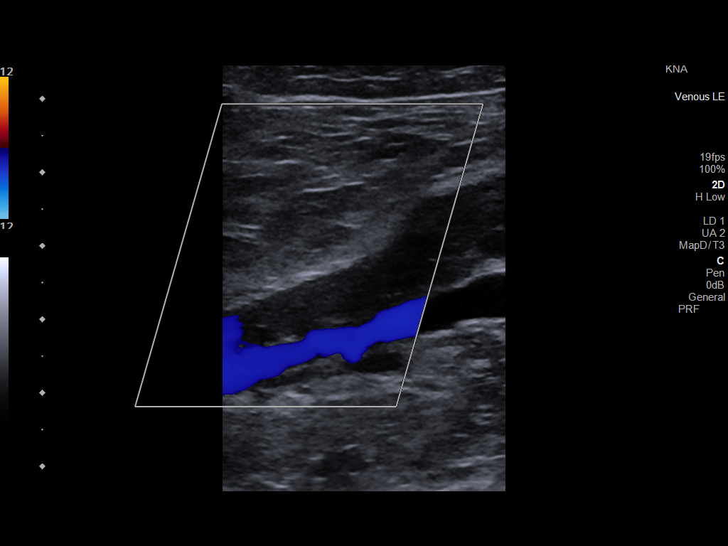
[im 31/48]
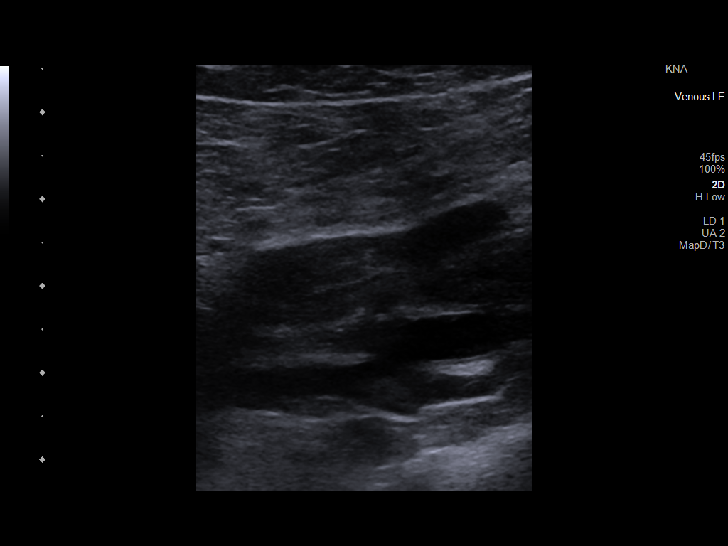
[im 35/48]
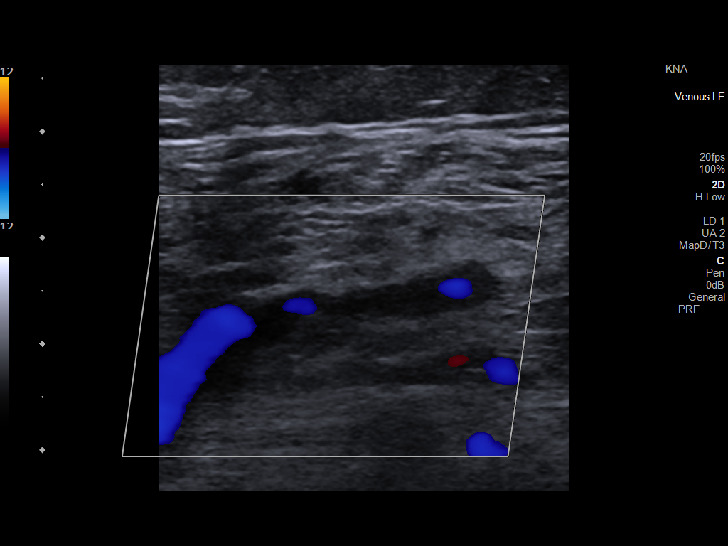
[im 39/48]
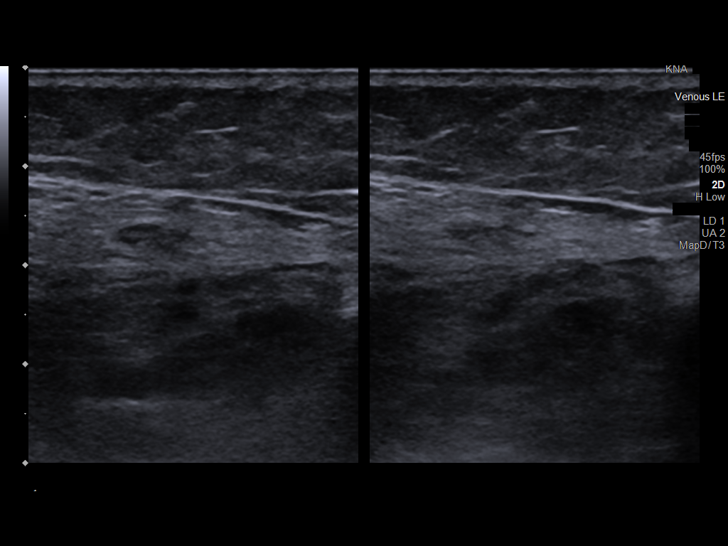
[im 43/48]
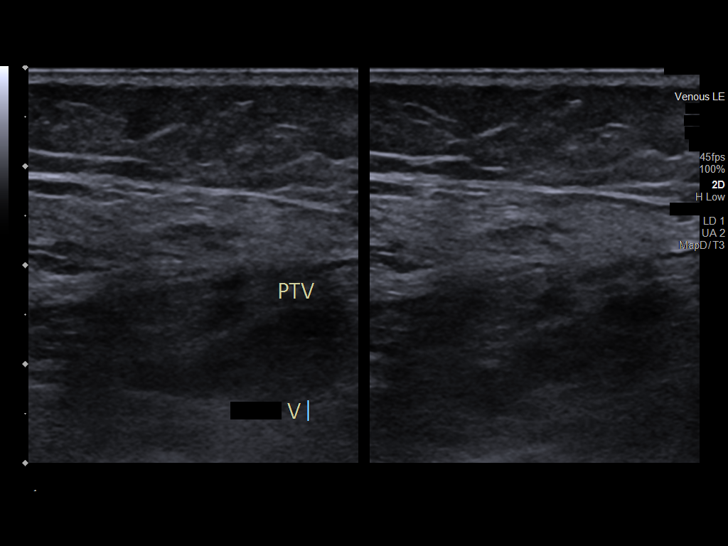
[im 48/48]
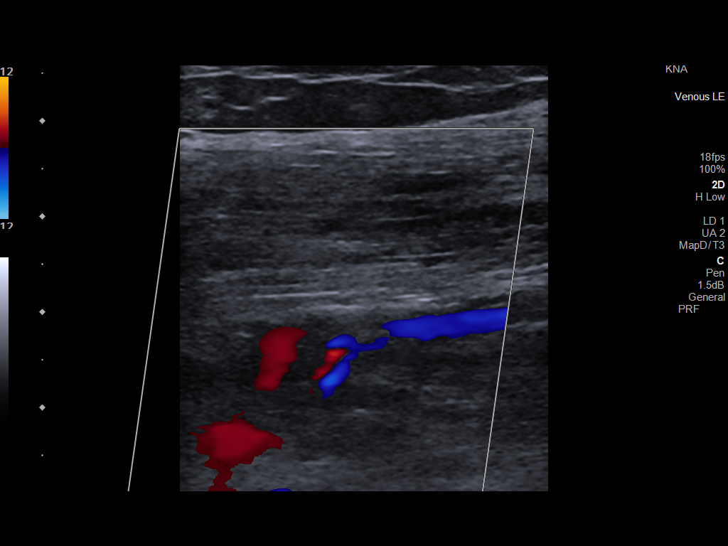

[13 of 24 positions shown; findings below may reference images not displayed]

FINDINGS: Contralateral Common Femoral Vein: Respiratory phasicity is normal
and symmetric with the symptomatic side. No evidence of thrombus.
Normal compressibility.

Common Femoral Vein: No evidence of thrombus. Normal
compressibility, respiratory phasicity and response to augmentation.

Saphenofemoral Junction: No evidence of thrombus. Normal
compressibility and flow on color Doppler imaging.

Profunda Femoral Vein: No evidence of thrombus. Normal
compressibility and flow on color Doppler imaging.

Femoral Vein: No evidence of thrombus. Normal compressibility,
respiratory phasicity and response to augmentation.

Popliteal Vein: The popliteal vein is not compressible. Lumen is
expanded and filled with low-level internal echoes consistent with
acute occlusive thrombus. No evidence of vascular flow on color
Doppler imaging.

Calf Veins: Occlusive thrombus extends into the gastrocnemius veins
within the calf. The posterior tibial and peroneal veins are patent
and compressible.

Superficial Great Saphenous Vein: No evidence of thrombus. Normal
compressibility.

Venous Reflux:  None.

Other Findings:  None.
IMPRESSION: Positive for acute occlusive DVT within the popliteal vein at the
knee and gastrocnemius veins within the calf.

These results will be called to the ordering clinician or
representative by the Radiologist Assistant, and communication
documented in the PACS or [REDACTED].

## 2023-04-24 DIAGNOSIS — E7801 Familial hypercholesterolemia: Secondary | ICD-10-CM | POA: Diagnosis not present

## 2023-04-24 DIAGNOSIS — R5382 Chronic fatigue, unspecified: Secondary | ICD-10-CM | POA: Diagnosis not present

## 2023-04-24 DIAGNOSIS — E7849 Other hyperlipidemia: Secondary | ICD-10-CM | POA: Diagnosis not present

## 2023-04-24 DIAGNOSIS — E118 Type 2 diabetes mellitus with unspecified complications: Secondary | ICD-10-CM | POA: Diagnosis not present

## 2023-04-24 DIAGNOSIS — N189 Chronic kidney disease, unspecified: Secondary | ICD-10-CM | POA: Diagnosis not present

## 2023-04-29 DIAGNOSIS — E782 Mixed hyperlipidemia: Secondary | ICD-10-CM | POA: Diagnosis not present

## 2023-04-29 DIAGNOSIS — I10 Essential (primary) hypertension: Secondary | ICD-10-CM | POA: Diagnosis not present

## 2023-04-29 DIAGNOSIS — N189 Chronic kidney disease, unspecified: Secondary | ICD-10-CM | POA: Diagnosis not present

## 2023-04-29 DIAGNOSIS — Z6833 Body mass index (BMI) 33.0-33.9, adult: Secondary | ICD-10-CM | POA: Diagnosis not present

## 2023-04-29 DIAGNOSIS — E119 Type 2 diabetes mellitus without complications: Secondary | ICD-10-CM | POA: Diagnosis not present

## 2023-04-29 DIAGNOSIS — M1990 Unspecified osteoarthritis, unspecified site: Secondary | ICD-10-CM | POA: Diagnosis not present

## 2023-04-29 DIAGNOSIS — E7849 Other hyperlipidemia: Secondary | ICD-10-CM | POA: Diagnosis not present

## 2023-04-29 DIAGNOSIS — F321 Major depressive disorder, single episode, moderate: Secondary | ICD-10-CM | POA: Diagnosis not present

## 2023-04-29 DIAGNOSIS — D692 Other nonthrombocytopenic purpura: Secondary | ICD-10-CM | POA: Diagnosis not present

## 2023-04-29 DIAGNOSIS — E118 Type 2 diabetes mellitus with unspecified complications: Secondary | ICD-10-CM | POA: Diagnosis not present

## 2023-04-29 DIAGNOSIS — R1310 Dysphagia, unspecified: Secondary | ICD-10-CM | POA: Diagnosis not present

## 2023-04-29 DIAGNOSIS — M2550 Pain in unspecified joint: Secondary | ICD-10-CM | POA: Diagnosis not present

## 2023-05-06 DIAGNOSIS — M25551 Pain in right hip: Secondary | ICD-10-CM | POA: Diagnosis not present

## 2023-05-06 DIAGNOSIS — M7061 Trochanteric bursitis, right hip: Secondary | ICD-10-CM | POA: Diagnosis not present

## 2023-05-22 ENCOUNTER — Ambulatory Visit (INDEPENDENT_AMBULATORY_CARE_PROVIDER_SITE_OTHER): Payer: PPO | Admitting: Obstetrics & Gynecology

## 2023-05-22 ENCOUNTER — Encounter: Payer: Self-pay | Admitting: Obstetrics & Gynecology

## 2023-05-22 VITALS — BP 121/73 | HR 64 | Ht 67.0 in | Wt 213.6 lb

## 2023-05-22 DIAGNOSIS — Z90710 Acquired absence of both cervix and uterus: Secondary | ICD-10-CM

## 2023-05-22 DIAGNOSIS — N952 Postmenopausal atrophic vaginitis: Secondary | ICD-10-CM | POA: Diagnosis not present

## 2023-05-22 DIAGNOSIS — N8110 Cystocele, unspecified: Secondary | ICD-10-CM

## 2023-05-22 NOTE — Progress Notes (Signed)
   GYN VISIT Patient name: Sarah Nguyen MRN [id number]  Date of birth: 1946-10-12 Chief Complaint:   Vaginal Prolapse  History of Present Illness:   Sarah Nguyen is a 77 y.o. [id number] PM, PH female being seen today for pelvic organ prolapse.     Patient was diagnosed many years ago by Dr. Emelda Fear.  She notes that the problem has progressively gotten a little bit worse.  She notes that she feels a bulge when she wipes.  Also in the morning when she first gets up she will leak urine.  She denies incontinence any other time.  Denies urinary retention.  Denies pelvic or abdominal pain.  Patient is not sexually active   Per patient pessaries previously tried without success, they had fallen out.  Patient strongly would prefer to avoid surgery.  She has had friends who have had the surgery and did not have good results  She reports no other acute GYN concerns  No LMP recorded. Patient has had a hysterectomy.    Review of Systems:   Pertinent items are noted in HPI Denies fever/chills, dizziness, headaches, visual disturbances, fatigue, shortness of breath, chest pain, abdominal pain, vomiting. Pertinent History Reviewed:   Past Surgical History:  Procedure Laterality Date   COLONOSCOPY W/ POLYPECTOMY     DILATION AND CURETTAGE OF UTERUS  1969   Ingrown toenail removal  77 years old   LUMBAR LAMINECTOMY WITH COFLEX 2 LEVEL N/A 03/26/2016   Procedure: Lumbar one-two Lumbar two -three Lumbar four-five Laminectomy with coflex;  Surgeon: Barnett Abu, MD;  Location: [facility];  Service: Neurosurgery;  Laterality: N/A;  L1-2 L2-3 L4-5 Laminectomy with coflex   LUMBAR SPINE SURGERY  1992   TONSILLECTOMY  77 years old   VAGINAL HYSTERECTOMY  1975    Past Medical History:  Diagnosis Date   Arthritis    Cancer (HCC)    Depression    DVT (deep venous thrombosis) (HCC)    Essential hypertension    Hyperlipidemia    Right leg DVT [facility])    August 2022   Seasonal allergies    Type 2  diabetes mellitus (HCC)    Reviewed problem list, medications and allergies. Physical Assessment:   Vitals:   05/22/23 1450  BP: 121/73  Pulse: 64  Weight: 213 lb 9.6 oz (96.9 kg)  Height: 5\' 7"  (1.702 m)  Body mass index is 33.45 kg/m.       Physical Examination:   General appearance: alert, well appearing, and in no distress  Psych: mood appropriate, normal affect  Skin: warm & dry   Cardiovascular: normal heart rate noted  Respiratory: normal respiratory effort, no distress  Abdomen: soft, non-tender   Pelvic: VULVA: normal appearing vulva with no masses, tenderness or lesions, VAGINA: Flat atrophic vaginal mucosa.  Stage III cystocele noted extremities: no edema   Chaperone:  Dr. Quincy Simmonds     Pessary trial attempted however as anticipated device fell out.  She was not willing to try a Gellhorn or other device  Assessment & Plan:  1) Stage 3 prolapse -Reviewed findings on exam -Discussed trial of alternative pessary -Discussed surgical intervention and referral to urogynecology -Patient declined at this time.  Should she have issues with urinary retention, recurrent UTIs or worsening of symptoms return to clinic   Myna Hidalgo, DO Attending Obstetrician & Gynecologist, [facility]

## 2023-06-02 DIAGNOSIS — Z961 Presence of intraocular lens: Secondary | ICD-10-CM | POA: Diagnosis not present

## 2023-06-02 DIAGNOSIS — H26493 Other secondary cataract, bilateral: Secondary | ICD-10-CM | POA: Diagnosis not present

## 2023-06-02 DIAGNOSIS — E119 Type 2 diabetes mellitus without complications: Secondary | ICD-10-CM | POA: Diagnosis not present

## 2023-06-02 DIAGNOSIS — Z7984 Long term (current) use of oral hypoglycemic drugs: Secondary | ICD-10-CM | POA: Diagnosis not present

## 2023-06-17 DIAGNOSIS — H35342 Macular cyst, hole, or pseudohole, left eye: Secondary | ICD-10-CM | POA: Diagnosis not present

## 2023-06-23 DIAGNOSIS — M7062 Trochanteric bursitis, left hip: Secondary | ICD-10-CM | POA: Diagnosis not present

## 2023-06-23 DIAGNOSIS — M7061 Trochanteric bursitis, right hip: Secondary | ICD-10-CM | POA: Diagnosis not present

## 2023-06-23 DIAGNOSIS — M25551 Pain in right hip: Secondary | ICD-10-CM | POA: Diagnosis not present

## 2023-07-03 DIAGNOSIS — H43822 Vitreomacular adhesion, left eye: Secondary | ICD-10-CM | POA: Diagnosis not present

## 2023-07-03 DIAGNOSIS — H43811 Vitreous degeneration, right eye: Secondary | ICD-10-CM | POA: Diagnosis not present

## 2023-07-03 DIAGNOSIS — H35372 Puckering of macula, left eye: Secondary | ICD-10-CM | POA: Diagnosis not present

## 2023-07-22 DIAGNOSIS — H43822 Vitreomacular adhesion, left eye: Secondary | ICD-10-CM | POA: Diagnosis not present

## 2023-07-22 DIAGNOSIS — H35372 Puckering of macula, left eye: Secondary | ICD-10-CM | POA: Diagnosis not present

## 2023-07-22 DIAGNOSIS — H26492 Other secondary cataract, left eye: Secondary | ICD-10-CM | POA: Diagnosis not present

## 2023-08-02 DIAGNOSIS — U071 COVID-19: Secondary | ICD-10-CM | POA: Diagnosis not present

## 2023-08-15 DIAGNOSIS — E118 Type 2 diabetes mellitus with unspecified complications: Secondary | ICD-10-CM | POA: Diagnosis not present

## 2023-08-15 DIAGNOSIS — E7801 Familial hypercholesterolemia: Secondary | ICD-10-CM | POA: Diagnosis not present

## 2023-08-15 DIAGNOSIS — R739 Hyperglycemia, unspecified: Secondary | ICD-10-CM | POA: Diagnosis not present

## 2023-08-15 DIAGNOSIS — N189 Chronic kidney disease, unspecified: Secondary | ICD-10-CM | POA: Diagnosis not present

## 2023-08-15 DIAGNOSIS — I10 Essential (primary) hypertension: Secondary | ICD-10-CM | POA: Diagnosis not present

## 2023-08-15 DIAGNOSIS — E7849 Other hyperlipidemia: Secondary | ICD-10-CM | POA: Diagnosis not present

## 2023-08-20 DIAGNOSIS — M1990 Unspecified osteoarthritis, unspecified site: Secondary | ICD-10-CM | POA: Diagnosis not present

## 2023-08-20 DIAGNOSIS — N189 Chronic kidney disease, unspecified: Secondary | ICD-10-CM | POA: Diagnosis not present

## 2023-08-20 DIAGNOSIS — R1310 Dysphagia, unspecified: Secondary | ICD-10-CM | POA: Diagnosis not present

## 2023-08-20 DIAGNOSIS — Z6833 Body mass index (BMI) 33.0-33.9, adult: Secondary | ICD-10-CM | POA: Diagnosis not present

## 2023-08-20 DIAGNOSIS — M2550 Pain in unspecified joint: Secondary | ICD-10-CM | POA: Diagnosis not present

## 2023-08-20 DIAGNOSIS — E7849 Other hyperlipidemia: Secondary | ICD-10-CM | POA: Diagnosis not present

## 2023-08-20 DIAGNOSIS — E118 Type 2 diabetes mellitus with unspecified complications: Secondary | ICD-10-CM | POA: Diagnosis not present

## 2023-08-20 DIAGNOSIS — I10 Essential (primary) hypertension: Secondary | ICD-10-CM | POA: Diagnosis not present

## 2023-08-20 DIAGNOSIS — D692 Other nonthrombocytopenic purpura: Secondary | ICD-10-CM | POA: Diagnosis not present

## 2023-08-20 DIAGNOSIS — F321 Major depressive disorder, single episode, moderate: Secondary | ICD-10-CM | POA: Diagnosis not present

## 2023-08-20 DIAGNOSIS — Z23 Encounter for immunization: Secondary | ICD-10-CM | POA: Diagnosis not present

## 2023-08-20 DIAGNOSIS — E119 Type 2 diabetes mellitus without complications: Secondary | ICD-10-CM | POA: Diagnosis not present

## 2023-09-10 DIAGNOSIS — H43813 Vitreous degeneration, bilateral: Secondary | ICD-10-CM | POA: Diagnosis not present

## 2023-09-10 DIAGNOSIS — Z9889 Other specified postprocedural states: Secondary | ICD-10-CM | POA: Diagnosis not present

## 2023-09-10 DIAGNOSIS — H35372 Puckering of macula, left eye: Secondary | ICD-10-CM | POA: Diagnosis not present

## 2023-09-29 DIAGNOSIS — M25551 Pain in right hip: Secondary | ICD-10-CM | POA: Diagnosis not present

## 2023-09-29 DIAGNOSIS — M47816 Spondylosis without myelopathy or radiculopathy, lumbar region: Secondary | ICD-10-CM | POA: Diagnosis not present

## 2023-09-29 DIAGNOSIS — M67853 Other specified disorders of tendon, right hip: Secondary | ICD-10-CM | POA: Diagnosis not present

## 2023-09-29 DIAGNOSIS — M67854 Other specified disorders of tendon, left hip: Secondary | ICD-10-CM | POA: Diagnosis not present

## 2023-09-29 DIAGNOSIS — S73191A Other sprain of right hip, initial encounter: Secondary | ICD-10-CM | POA: Diagnosis not present

## 2023-09-29 DIAGNOSIS — M160 Bilateral primary osteoarthritis of hip: Secondary | ICD-10-CM | POA: Diagnosis not present

## 2023-10-03 DIAGNOSIS — M7061 Trochanteric bursitis, right hip: Secondary | ICD-10-CM | POA: Diagnosis not present

## 2023-10-03 DIAGNOSIS — M25551 Pain in right hip: Secondary | ICD-10-CM | POA: Diagnosis not present

## 2023-11-21 DIAGNOSIS — R739 Hyperglycemia, unspecified: Secondary | ICD-10-CM | POA: Diagnosis not present

## 2023-11-21 DIAGNOSIS — E7801 Familial hypercholesterolemia: Secondary | ICD-10-CM | POA: Diagnosis not present

## 2023-11-21 DIAGNOSIS — E1122 Type 2 diabetes mellitus with diabetic chronic kidney disease: Secondary | ICD-10-CM | POA: Diagnosis not present

## 2023-11-21 DIAGNOSIS — N189 Chronic kidney disease, unspecified: Secondary | ICD-10-CM | POA: Diagnosis not present

## 2023-11-21 DIAGNOSIS — E7849 Other hyperlipidemia: Secondary | ICD-10-CM | POA: Diagnosis not present

## 2023-11-21 DIAGNOSIS — I10 Essential (primary) hypertension: Secondary | ICD-10-CM | POA: Diagnosis not present

## 2023-11-25 DIAGNOSIS — Z6834 Body mass index (BMI) 34.0-34.9, adult: Secondary | ICD-10-CM | POA: Diagnosis not present

## 2023-11-25 DIAGNOSIS — F321 Major depressive disorder, single episode, moderate: Secondary | ICD-10-CM | POA: Diagnosis not present

## 2023-11-25 DIAGNOSIS — E1122 Type 2 diabetes mellitus with diabetic chronic kidney disease: Secondary | ICD-10-CM | POA: Diagnosis not present

## 2023-11-25 DIAGNOSIS — E7849 Other hyperlipidemia: Secondary | ICD-10-CM | POA: Diagnosis not present

## 2023-11-25 DIAGNOSIS — R1310 Dysphagia, unspecified: Secondary | ICD-10-CM | POA: Diagnosis not present

## 2023-11-25 DIAGNOSIS — D692 Other nonthrombocytopenic purpura: Secondary | ICD-10-CM | POA: Diagnosis not present

## 2023-11-25 DIAGNOSIS — I10 Essential (primary) hypertension: Secondary | ICD-10-CM | POA: Diagnosis not present

## 2023-11-25 DIAGNOSIS — N189 Chronic kidney disease, unspecified: Secondary | ICD-10-CM | POA: Diagnosis not present

## 2023-11-25 DIAGNOSIS — M2550 Pain in unspecified joint: Secondary | ICD-10-CM | POA: Diagnosis not present

## 2023-11-25 DIAGNOSIS — M1990 Unspecified osteoarthritis, unspecified site: Secondary | ICD-10-CM | POA: Diagnosis not present

## 2024-01-04 IMAGING — US US BREAST*L* LIMITED INC AXILLA
1 series · 13 of 14 positions shown · non-contrast
Comparison: Previous exam(s).

CLINICAL DATA: Possible mass in the upper-outer left breast on
recent screening mammogram.

EXAM:
DIGITAL DIAGNOSTIC UNILATERAL LEFT MAMMOGRAM WITH TOMOSYNTHESIS AND
CAD; ULTRASOUND LEFT BREAST LIMITED
TECHNIQUE: Left digital diagnostic mammography and breast tomosynthesis was
performed. The images were evaluated with computer-aided detection.;
Targeted ultrasound examination of the left breast was performed.

[Series 1: us breast*left* limited inc axilla · 0.07mm/px · 13 of 14 slices shown]
[im 1/14]
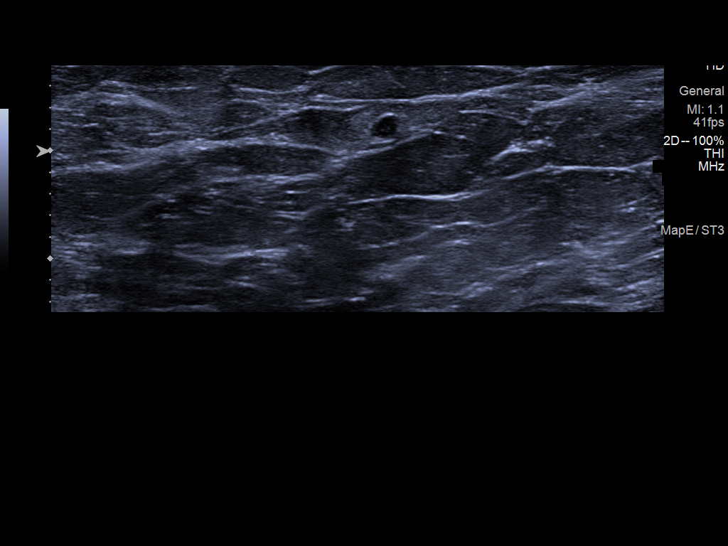
[im 2/14]
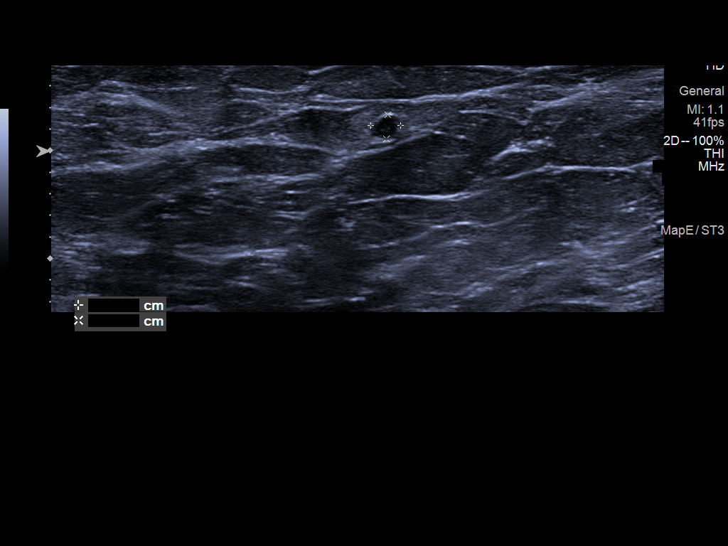
[im 3/14]
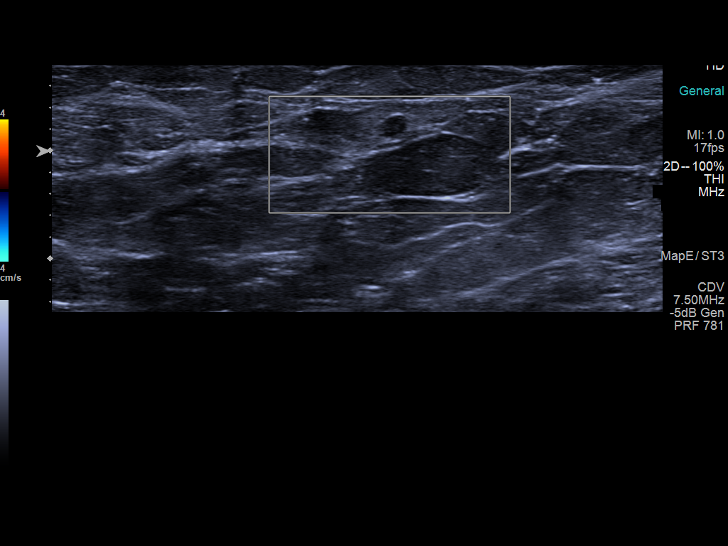
[im 4/14]
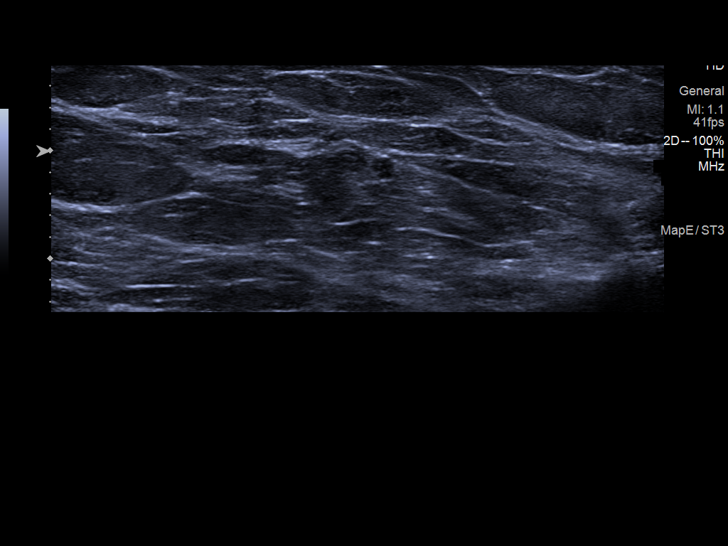
[im 5/14]
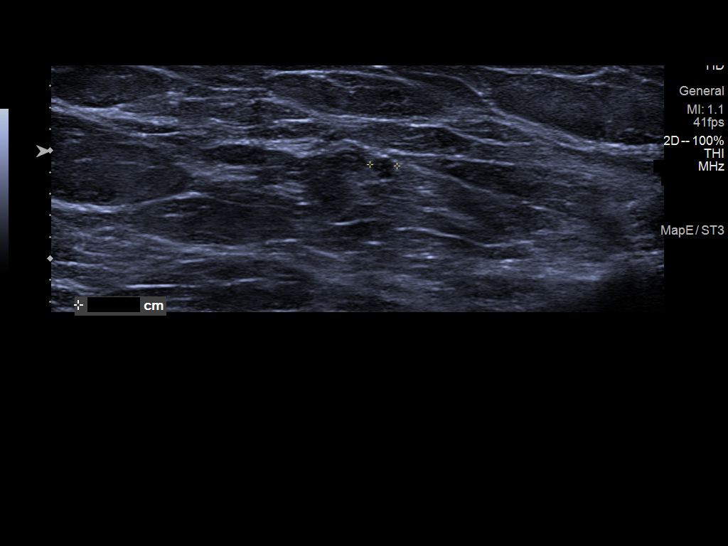
[im 6/14]
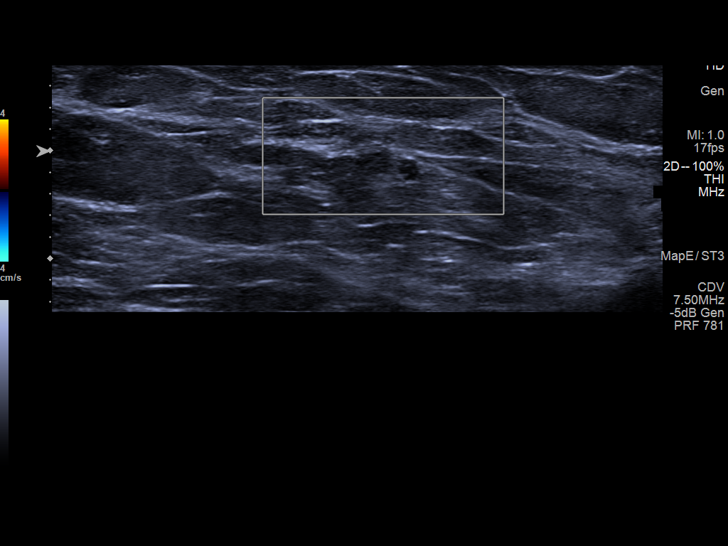
[im 8/14]
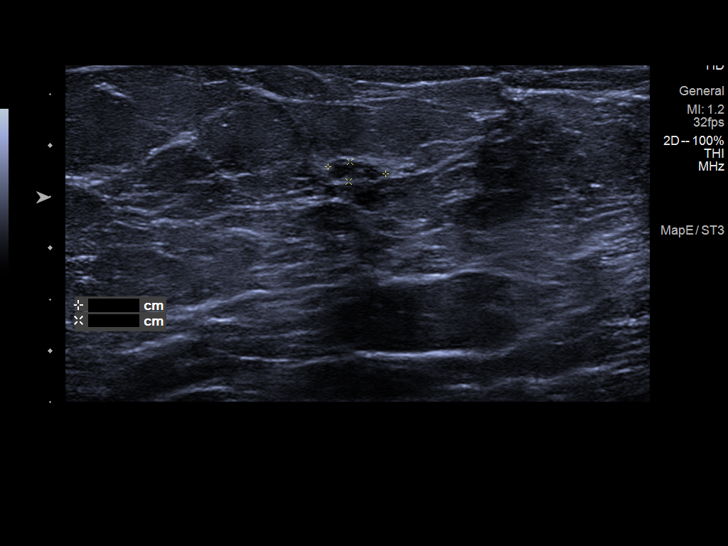
[im 9/14]
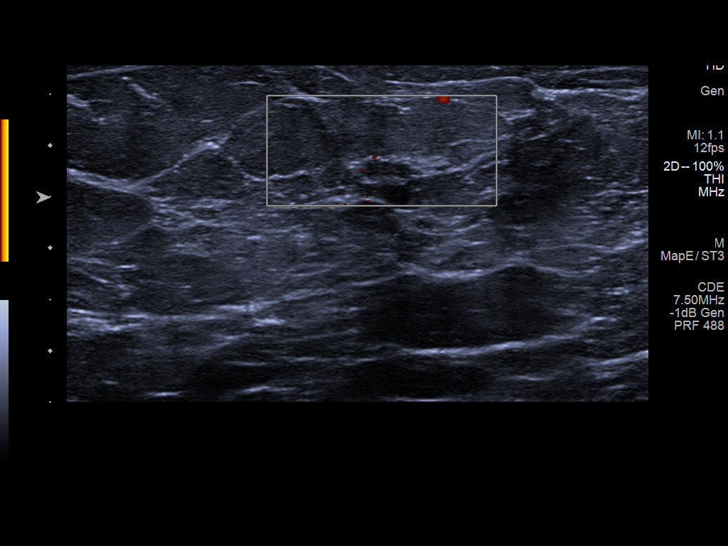
[im 10/14]
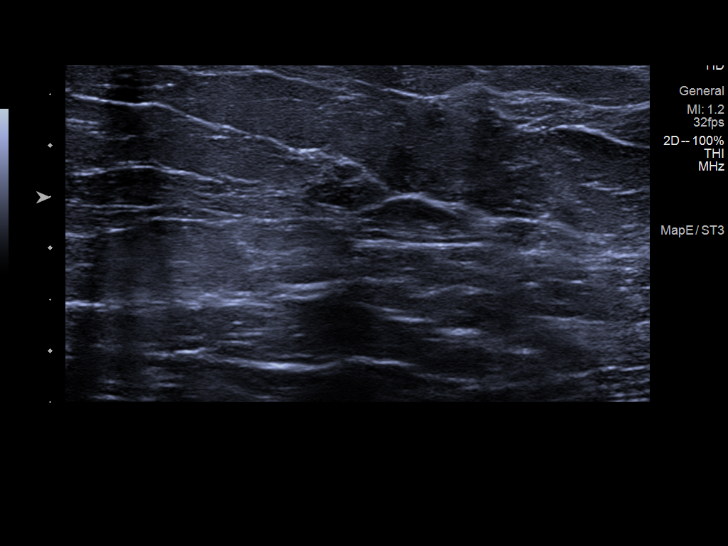
[im 11/14]
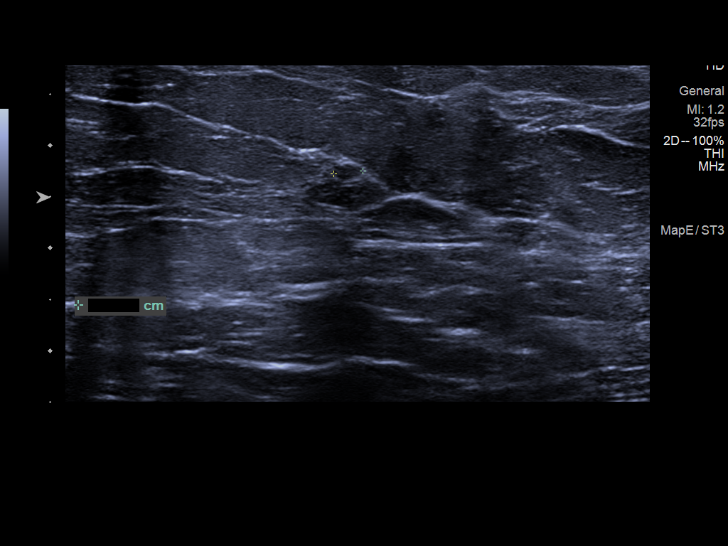
[im 12/14]
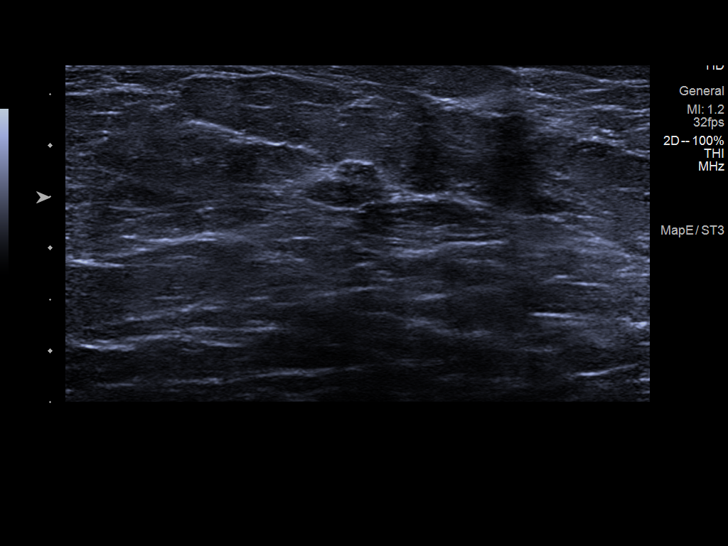
[im 13/14]
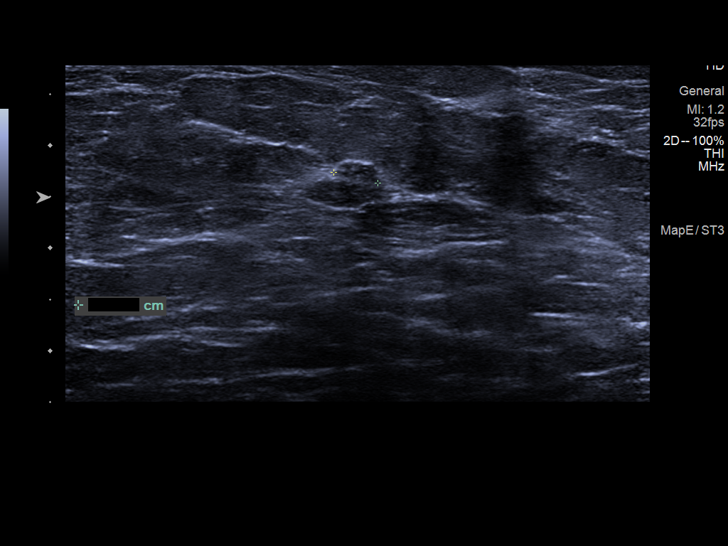
[im 14/14]
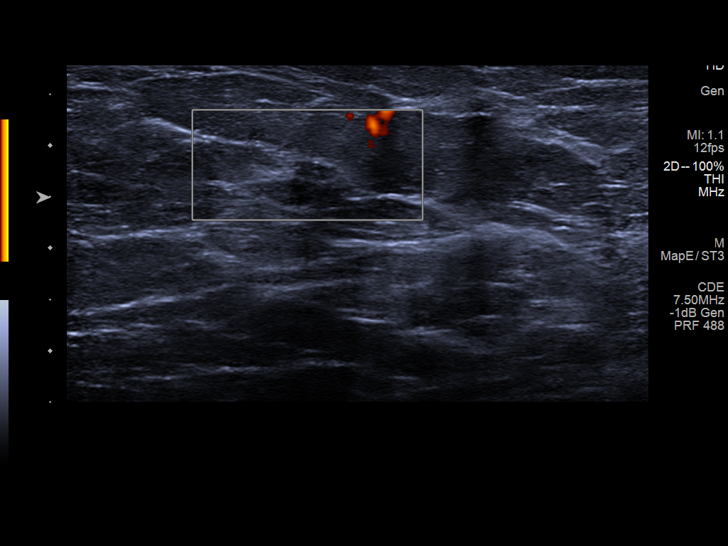

[13 of 14 positions shown; findings below may reference images not displayed]

ACR Breast Density Category b: There are scattered areas of
fibroglandular density.
FINDINGS: 3D tomographic and 2D generated spot compression images of the left
breast confirm a small, oval mass in the upper-outer quadrant of the
breast. This has some circumscribed and some indistinct margins.

On physical exam, no mass is palpable in the outer left breast.

Targeted ultrasound is performed, showing a 6 x 4 x 2 mm oval,
horizontally oriented, hypoechoic mass with punctate internal echoes
in the 2 o'clock position of the left breast, 5 cm from the nipple.
No internal blood flow was demonstrated with power Doppler. This
corresponds to the mammographic mass.
IMPRESSION: 1. 6 mm benign, mildly complicated cyst in the 2 o'clock position of
the left breast.
2. No evidence of malignancy.

RECOMMENDATION:
Bilateral screening mammogram in February 2023 when due.

I have discussed the findings and recommendations with the patient.
If applicable, a reminder letter will be sent to the patient
regarding the next appointment.

BI-RADS CATEGORY  2: Benign.

## 2024-01-04 IMAGING — MG MM DIGITAL DIAGNOSTIC UNILAT*L* W/ TOMO W/ CAD
4 series · 4 of 12 positions shown · non-contrast
Comparison: Previous exam(s).

CLINICAL DATA: Possible mass in the upper-outer left breast on
recent screening mammogram.

EXAM:
DIGITAL DIAGNOSTIC UNILATERAL LEFT MAMMOGRAM WITH TOMOSYNTHESIS AND
CAD; ULTRASOUND LEFT BREAST LIMITED
TECHNIQUE: Left digital diagnostic mammography and breast tomosynthesis was
performed. The images were evaluated with computer-aided detection.;
Targeted ultrasound examination of the left breast was performed.

[L CC synth-2D]
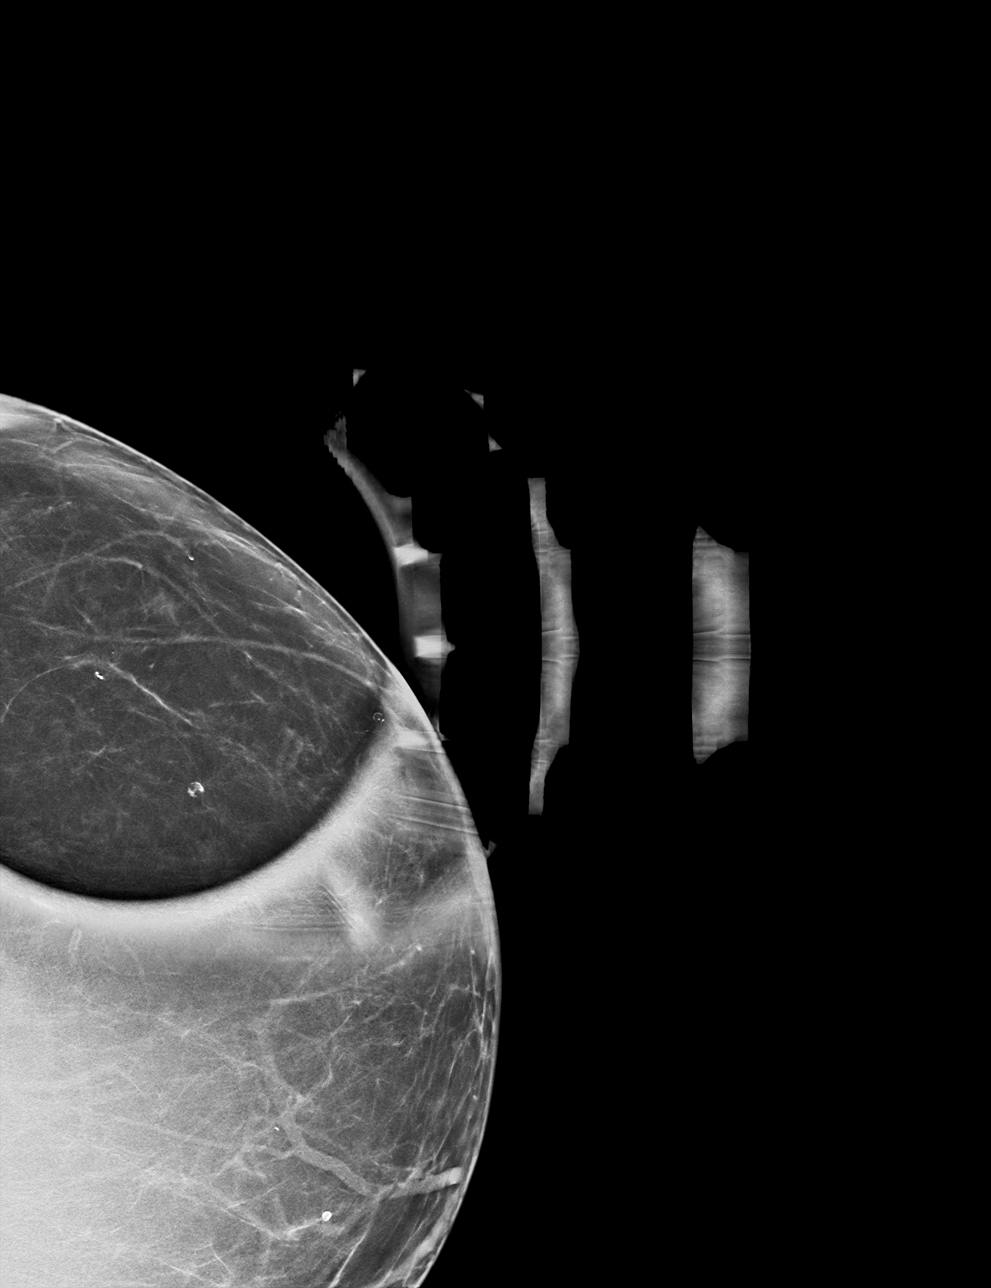

[L MLO synth-2D]
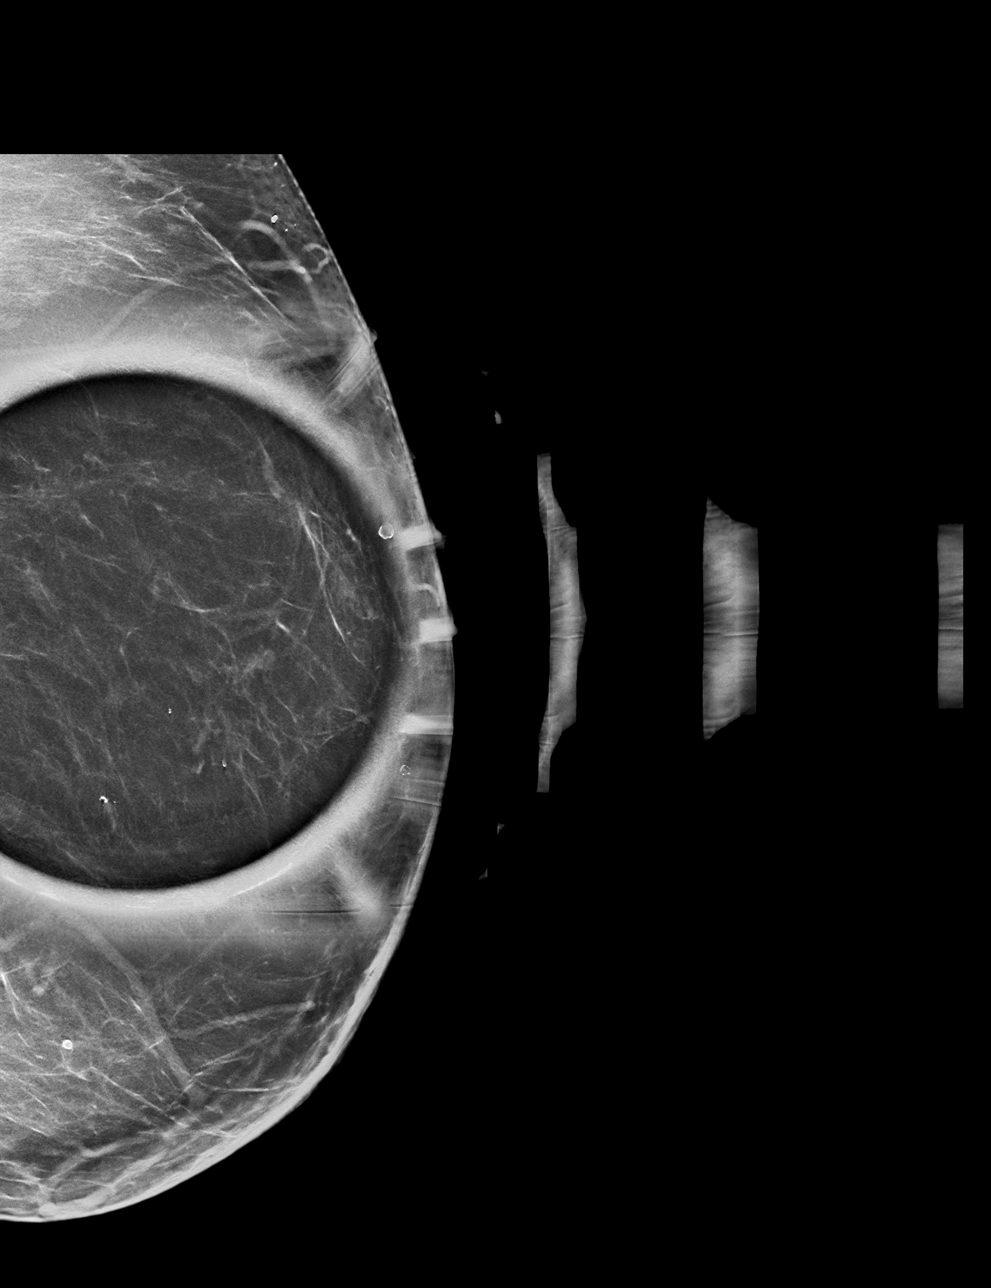

[L MLO tomo · tomo slice 36/71.0]
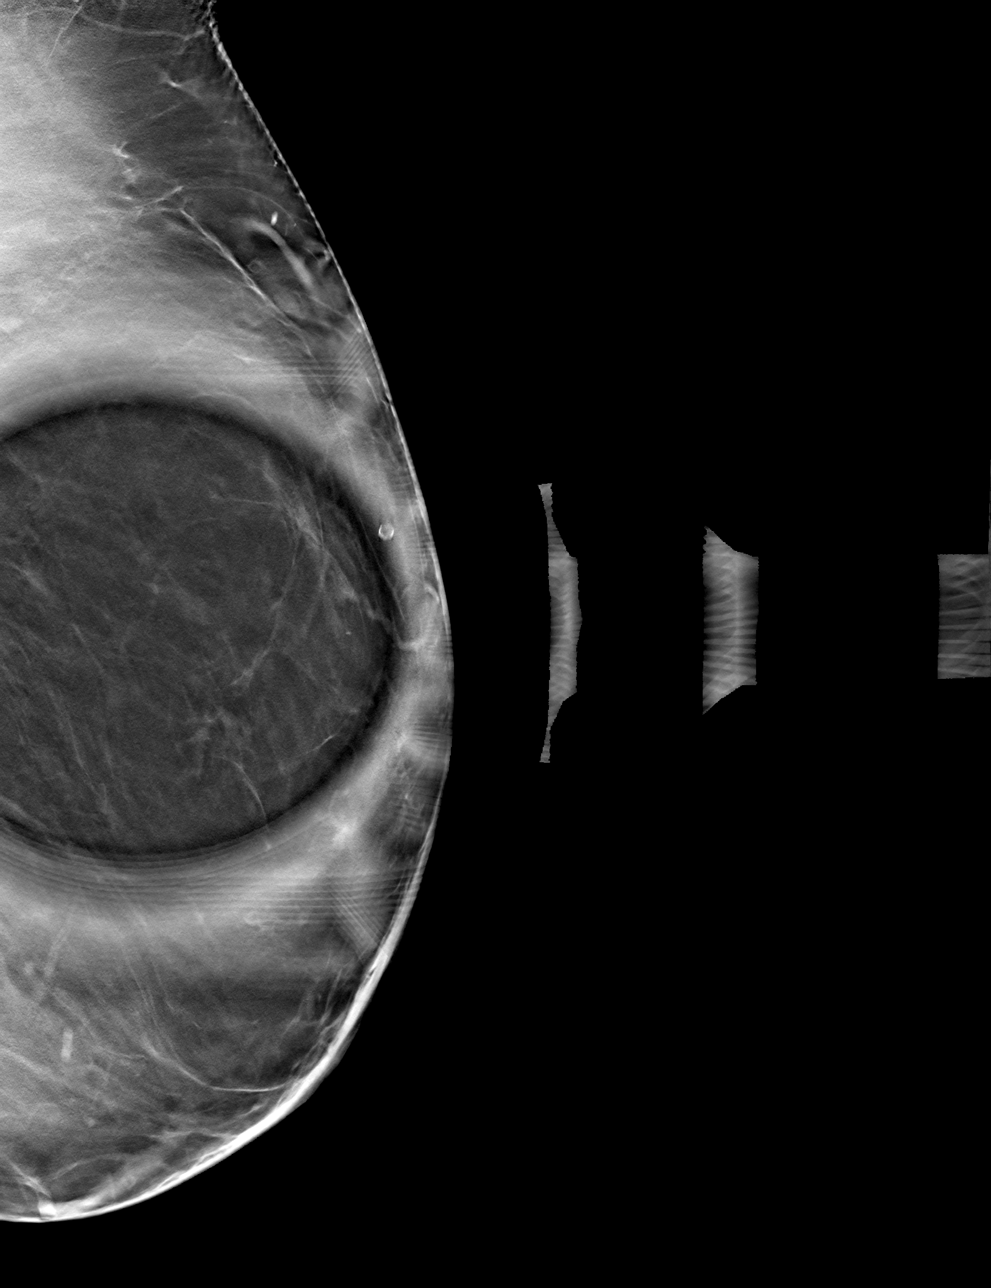

[L CC tomo · tomo slice 33/64.0]
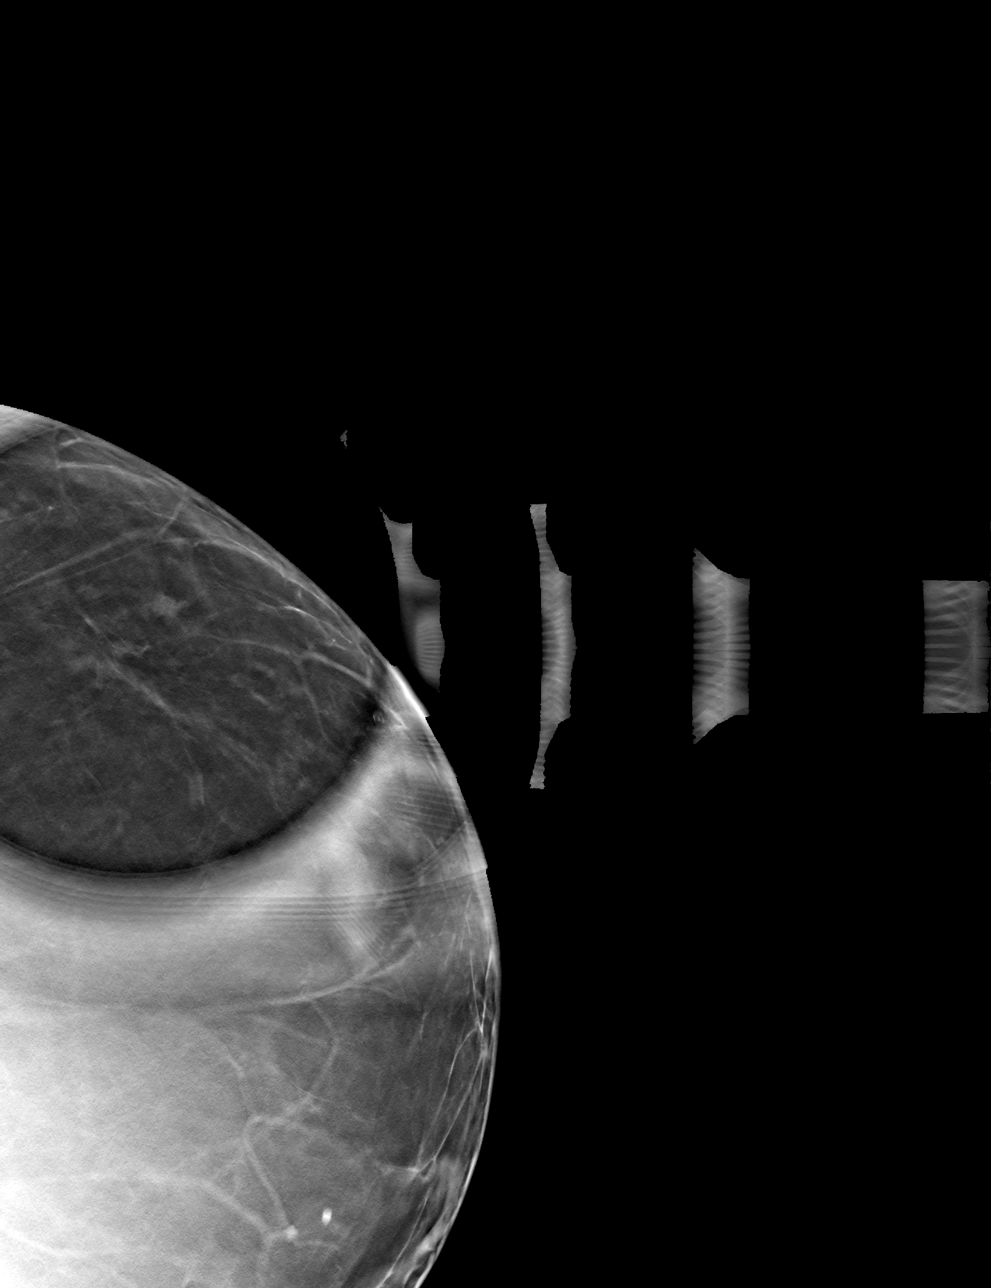

[4 of 12 positions shown; findings below may reference images not displayed]

ACR Breast Density Category b: There are scattered areas of
fibroglandular density.
FINDINGS: 3D tomographic and 2D generated spot compression images of the left
breast confirm a small, oval mass in the upper-outer quadrant of the
breast. This has some circumscribed and some indistinct margins.

On physical exam, no mass is palpable in the outer left breast.

Targeted ultrasound is performed, showing a 6 x 4 x 2 mm oval,
horizontally oriented, hypoechoic mass with punctate internal echoes
in the 2 o'clock position of the left breast, 5 cm from the nipple.
No internal blood flow was demonstrated with power Doppler. This
corresponds to the mammographic mass.
IMPRESSION: 1. 6 mm benign, mildly complicated cyst in the 2 o'clock position of
the left breast.
2. No evidence of malignancy.

RECOMMENDATION:
Bilateral screening mammogram in February 2023 when due.

I have discussed the findings and recommendations with the patient.
If applicable, a reminder letter will be sent to the patient
regarding the next appointment.

BI-RADS CATEGORY  2: Benign.

## 2024-01-23 ENCOUNTER — Other Ambulatory Visit (HOSPITAL_COMMUNITY): Payer: Self-pay | Admitting: Family Medicine

## 2024-01-23 DIAGNOSIS — Z1231 Encounter for screening mammogram for malignant neoplasm of breast: Secondary | ICD-10-CM

## 2024-03-10 ENCOUNTER — Ambulatory Visit (HOSPITAL_COMMUNITY): Payer: PPO

## 2024-03-12 ENCOUNTER — Encounter (HOSPITAL_COMMUNITY): Payer: Self-pay

## 2024-03-12 ENCOUNTER — Ambulatory Visit (HOSPITAL_COMMUNITY)
Admission: RE | Admit: 2024-03-12 | Discharge: 2024-03-12 | Disposition: A | Discharge status: Home / Self Care | Payer: Self-pay | Source: Ambulatory Visit | Attending: Family Medicine | Admitting: Family Medicine

## 2024-03-12 DIAGNOSIS — Z1231 Encounter for screening mammogram for malignant neoplasm of breast: Secondary | ICD-10-CM | POA: Diagnosis present

## 2024-05-27 ENCOUNTER — Inpatient Hospital Stay

## 2024-05-27 ENCOUNTER — Inpatient Hospital Stay: Attending: Hematology | Admitting: Hematology

## 2024-05-27 VITALS — BP 118/82 | HR 79 | Temp 99.3°F | Resp 18 | Ht 66.54 in | Wt 209.2 lb

## 2024-05-27 DIAGNOSIS — Z86711 Personal history of pulmonary embolism: Secondary | ICD-10-CM | POA: Diagnosis present

## 2024-05-27 DIAGNOSIS — I2699 Other pulmonary embolism without acute cor pulmonale: Secondary | ICD-10-CM

## 2024-05-27 DIAGNOSIS — Z8541 Personal history of malignant neoplasm of cervix uteri: Secondary | ICD-10-CM | POA: Insufficient documentation

## 2024-05-27 DIAGNOSIS — Z86718 Personal history of other venous thrombosis and embolism: Secondary | ICD-10-CM | POA: Diagnosis present

## 2024-05-27 DIAGNOSIS — Z7901 Long term (current) use of anticoagulants: Secondary | ICD-10-CM | POA: Insufficient documentation

## 2024-05-27 NOTE — Patient Instructions (Addendum)
 You were seen and examined today by Dr. Cheree Cords. Dr. Cheree Cords is a hematologist, meaning that he specializes in blood abnormalities. Dr. Cheree Cords discussed your past medical history, family history of cancers/blood conditions and the events that led to you being here today.  You were referred to Dr. Cheree Cords due to forming unprovoked blood clots.  Follow-up as scheduled.

## 2024-05-27 NOTE — Progress Notes (Signed)
 [facility] [address], [place] [address]   Clinic Day:  05/27/2024  Referring physician: Anthonette Bastos, PA-C  Patient Care Team: Alston Jerry, MD as PCP - General (Family Medicine) Gerard Knight, MD as PCP - Cardiology (Cardiology) Elna Haggis, MD as Consulting Physician (Neurosurgery)   ASSESSMENT & PLAN:   Assessment:  1.  Recurrent DVT and saddle pulmonary embolus: - First episode of unprovoked right leg DVT on 06/26/2021, with Doppler showing popliteal and right gastrocnemius occlusion, treated with 4 months of Xarelto. - Had a long road trip to [place]  around 04/25/2024, but got off the car frequently every 2 hours. - She was admitted to [facility] on 05/16/2023, transferred to [facility] as CT angiogram showed acute saddle type pulmonary embolism with nearly occlusive thrombus extending into the right lower lobe pulmonary artery.  Suggestion of right heart strain. - Bilateral leg ultrasound: Acute DVT in the right proximal veins and calf veins.  Acute DVT in the left proximal veins, calf veins and intramuscular calf veins. - She had thrombectomy done. - Xarelto was started.  She is tolerating it well. - Last mammogram on 03/12/2024: Normal.  Colonoscopy 6 years ago in [place] was reportedly normal.  She is a non-smoker. - She had a history of?  Cervical cancer at age 10, underwent TAH.  2. Social/Family History: -Patient has a history of vaginal or cervical cancer in her 20's  s/p vaginal hysterectomy. No tobacco use. -No family history of lupus anticoagulant. Mother had a miscarriage. Her son has a history of DVT. Father had metastatic melanoma. Brother died of prostate cancer. Paternal grandmother had leukemia. Paternal uncle had cancer, type unknown. Paternal aunt has had 6-7 different cancers. Paternal aunt died of breast cancer. Another paternal aunt had esophageal cancer.   Plan:  1.  Recurrent unprovoked DVT and saddle pulmonary  embolism: - We reviewed imaging studies. - Recommended indefinite anticoagulation as she had 2 episodes of unprovoked DVT and saddle embolus. - All her screenings are up-to-date. - I have recommended indefinite anticoagulation with Xarelto. - Recommend follow-up in 6 months.  Will check D-dimer, lupus anticoagulant, anticardiolipin antibodies, antibeta 2 glycoprotein 1 antibodies prior to next visit.  If they are negative, it will rule out antiphospholipid syndrome.   Orders Placed This Encounter  Procedures   Cardiolipin antibodies, IgG, IgM, IgA    Standing Status:   Future    Expected Date:   11/22/2024    Expiration Date:   02/20/2025   Beta-2-glycoprotein i abs, IgG/M/A    Standing Status:   Future    Expected Date:   11/22/2024    Expiration Date:   02/20/2025   D-dimer, quantitative    Standing Status:   Future    Expected Date:   11/22/2024    Expiration Date:   02/20/2025   Lupus anticoagulant panel    Standing Status:   Future    Expected Date:   11/22/2024    Expiration Date:   02/20/2025   CBC with Differential    Standing Status:   Future    Expected Date:   11/22/2024    Expiration Date:   02/20/2025      Hurman Maiden R Teague,acting as a scribe for Paulett Boros, MD.,have documented all relevant documentation on the behalf of Paulett Boros, MD,as directed by  Paulett Boros, MD while in the presence of Paulett Boros, MD.   I, Paulett Boros MD, have reviewed the above documentation for  accuracy and completeness, and I agree with the above.   Paulett Boros, MD   6/12/20252:21 PM  CHIEF COMPLAINT/PURPOSE OF CONSULT:   Diagnosis: Unprovoked bilateral DVT and saddle pulmonary embolus  Current Therapy: Xarelto  HISTORY OF PRESENT ILLNESS:   Sarah Nguyen is a 78 y.o. female presenting to clinic today for evaluation of recent PE and history of DVT at the request of Anthonette Bastos, PA-C.  Patient has a medical history of hypertension, hyperlipidemia,  DM type II, CKD stage 3a, spinal stenosis, cervical DJD, and osteoarthritis.   Kesley presented to the ED at [facility] on 05/15/24 and was transferred to [facility] from 05/15/24 to 05/17/24 for an acute saddle PE with nearly occlusive thrombus extending into the right lower lobar pulmonary artery and right heart strain. She was also noted to have bilateral lower extremity DVT, including mobile tips in the right popliteal and left proximal femoral vein. Christinea underwent flowtriever thrombectomy on 6/1. She was treated with heparin while hospitalized and had her heart catheter removed. After discharge, she was started on Xarelto 15 mg BID x 3 weeks then 20 mg daily. She was seen by her PCP at [facility] on 05/24/24 who then referred her to me.   She has a prior history of unprovoked DVT of the right popliteal vein on 06/26/2021 (s/p 4 months of Xarelto).   Today, she states that she is doing well overall. Her appetite level is at 0%. Her energy level is at 25%.   Lilas notes she had no provoking factors for either instances of blood clots. She was on a long car ride on Mother's Day (04/25/24) but she stopped every 2-3 hours. She has a history of one first trimester miscarriage in 19. Her mother may have had a miscarriage.   Roneshia reports weight loss, though she attributes this to decreased appetite. She also notes decreased energy levels. She denies any bleeding issues from Xarelto. She denies any chest pain.   Patient has a history of vaginal or cervical cancer in her 20's  s/p vaginal hysterectomy. Her last colonoscopy was in 2018 and she states it was normal.   PAST MEDICAL HISTORY:   Past Medical History: Past Medical History:  Diagnosis Date   Arthritis    Cancer (HCC)    Depression    DVT (deep venous thrombosis) (HCC)    Essential hypertension    Hyperlipidemia    Right leg DVT [facility])    August 2022   Seasonal allergies    Type 2 diabetes mellitus (HCC)     Surgical History: Past Surgical  History:  Procedure Laterality Date   COLONOSCOPY W/ POLYPECTOMY     DILATION AND CURETTAGE OF UTERUS  1969   Ingrown toenail removal  78 years old   LUMBAR LAMINECTOMY WITH COFLEX 2 LEVEL N/A 03/26/2016   Procedure: Lumbar one-two Lumbar two -three Lumbar four-five Laminectomy with coflex;  Surgeon: Elna Haggis, MD;  Location: [facility];  Service: Neurosurgery;  Laterality: N/A;  L1-2 L2-3 L4-5 Laminectomy with coflex   LUMBAR SPINE SURGERY  1992   TONSILLECTOMY  78 years old   VAGINAL HYSTERECTOMY  1975    Social History: Social History   Socioeconomic History   Marital status: Divorced    Spouse name: Not on file   Number of children: Not on file   Years of education: Not on file   Highest education level: Not on file  Occupational History   Not on file  Tobacco Use  Smoking status: Never   Smokeless tobacco: Never  Vaping Use   Vaping status: Never Used  Substance and Sexual Activity   Alcohol  use: No    Alcohol /week: 0.0 standard drinks of alcohol    Drug use: No   Sexual activity: Not Currently  Other Topics Concern   Not on file  Social History Narrative   Not on file   Social Drivers of Health   Financial Resource Strain: Not on file  Food Insecurity: No Food Insecurity (05/27/2024)   Hunger Vital Sign    Worried About Running Out of Food in the Last Year: Never true    Ran Out of Food in the Last Year: Never true  Transportation Needs: No Transportation Needs (05/27/2024)   PRAPARE - Administrator, Civil Service (Medical): No    Lack of Transportation (Non-Medical): No  Physical Activity: Not on file  Stress: Not on file  Social Connections: Not on file  Intimate Partner Violence: Not At Risk (05/27/2024)   Humiliation, Afraid, Rape, and Kick questionnaire    Fear of Current or Ex-Partner: No    Emotionally Abused: No    Physically Abused: No    Sexually Abused: No    Family History: Family History  Problem Relation Age of Onset    Diabetes Mellitus II Mother    Melanoma Father    Stroke Mother    Cancer Mother    Stroke Brother    Arrhythmia Mother     Current Medications:  Current Outpatient Medications:    atorvastatin  (LIPITOR) 20 MG tablet, Take 20 mg by mouth at bedtime. , Disp: , Rfl:    loratadine  (CLARITIN ) 10 MG tablet, Take 10 mg by mouth at bedtime. , Disp: , Rfl:    losartan -hydrochlorothiazide  (HYZAAR) 50-12.5 MG tablet, Take 1 tablet by mouth at bedtime. , Disp: , Rfl:    metFORMIN  (GLUCOPHAGE -XR) 500 MG 24 hr tablet, Take 1,000 mg by mouth at bedtime. , Disp: , Rfl: 1   metoprolol  succinate (TOPROL -XL) 25 MG 24 hr tablet, Take 25 mg by mouth at bedtime. , Disp: , Rfl: 1   Rivaroxaban (XARELTO) 15 MG TABS tablet, Take 15 mg by mouth. (Patient taking differently: Take 15 mg by mouth in the morning and at bedtime.), Disp: , Rfl:    Allergies: Allergies  Allergen Reactions   Ace Inhibitors Cough   Sulfa Antibiotics Other (See Comments)    Unspecified   Codeine Itching    REVIEW OF SYSTEMS:   Review of Systems  Constitutional:  Positive for fatigue. Negative for chills and fever.  HENT:   Negative for lump/mass, mouth sores, nosebleeds, sore throat and trouble swallowing.   Eyes:  Negative for eye problems.  Respiratory:  Positive for shortness of breath. Negative for cough.   Cardiovascular:  Negative for chest pain, leg swelling and palpitations.  Gastrointestinal:  Positive for diarrhea. Negative for abdominal pain, constipation, nausea and vomiting.  Genitourinary:  Negative for bladder incontinence, difficulty urinating, dysuria, frequency, hematuria and nocturia.   Musculoskeletal:  Negative for arthralgias, back pain, flank pain, myalgias and neck pain.  Skin:  Negative for itching and rash.  Neurological:  Positive for dizziness and headaches. Negative for numbness.       +tingling in hands and feet  Hematological:  Bruises/bleeds easily.  Psychiatric/Behavioral:  Negative for  depression, sleep disturbance and suicidal ideas. The patient is nervous/anxious.   All other systems reviewed and are negative.    VITALS:   Blood pressure  118/82, pulse 79, temperature 99.3 F (37.4 C), temperature source Tympanic, resp. rate 18, height 5' 6.54 (1.69 m), weight 209 lb 3.5 oz (94.9 kg), SpO2 97%.  Wt Readings from Last 3 Encounters:  05/27/24 209 lb 3.5 oz (94.9 kg)  05/22/23 213 lb 9.6 oz (96.9 kg)  12/03/21 224 lb (101.6 kg)    Body mass index is 33.23 kg/m.   PHYSICAL EXAM:   Physical Exam Vitals and nursing note reviewed. Exam conducted with a chaperone present.  Constitutional:      Appearance: Normal appearance.   Cardiovascular:     Rate and Rhythm: Normal rate and regular rhythm.     Pulses: Normal pulses.     Heart sounds: Normal heart sounds.  Pulmonary:     Effort: Pulmonary effort is normal.     Breath sounds: Normal breath sounds.  Abdominal:     Palpations: Abdomen is soft. There is no hepatomegaly, splenomegaly or mass.     Tenderness: There is no abdominal tenderness.   Musculoskeletal:     Right lower leg: No edema.     Left lower leg: No edema.  Lymphadenopathy:     Cervical: No cervical adenopathy.     Right cervical: No superficial, deep or posterior cervical adenopathy.    Left cervical: No superficial, deep or posterior cervical adenopathy.     Upper Body:     Right upper body: No supraclavicular or axillary adenopathy.     Left upper body: No supraclavicular or axillary adenopathy.   Neurological:     General: No focal deficit present.     Mental Status: She is alert and oriented to person, place, and time.   Psychiatric:        Mood and Affect: Mood normal.        Behavior: Behavior normal.     LABS:   CBC    Component Value Date/Time   WBC 4.7 03/18/2016 1407   RBC 4.66 03/18/2016 1407   HGB 13.2 03/18/2016 1407   HCT 41.8 03/18/2016 1407   PLT 255 03/18/2016 1407   MCV 89.7 03/18/2016 1407   MCH 28.3  03/18/2016 1407   MCHC 31.6 03/18/2016 1407   RDW 14.7 03/18/2016 1407    CMP    Component Value Date/Time   NA 142 03/18/2016 1407   K 4.0 03/18/2016 1407   CL 106 03/18/2016 1407   CO2 22 03/18/2016 1407   GLUCOSE 130 (H) 03/18/2016 1407   BUN 17 03/18/2016 1407   CREATININE 0.97 03/18/2016 1407   CALCIUM  9.3 03/18/2016 1407   GFRNONAA 59 (L) 03/18/2016 1407   GFRAA >60 03/18/2016 1407    No results found for: CEA1, CEA / No results found for: CEA1, CEA No results found for: PSA1 No results found for: ZOX096 No results found for: CAN125  No results found for: TOTALPROTELP, ALBUMINELP, A1GS, A2GS, BETS, BETA2SER, GAMS, MSPIKE, SPEI No results found for: TIBC, FERRITIN, IRONPCTSAT No results found for: LDH   STUDIES:   No results found.

## 2024-06-29 ENCOUNTER — Encounter: Payer: Self-pay | Admitting: *Deleted

## 2024-06-29 ENCOUNTER — Encounter: Payer: Self-pay | Admitting: Cardiology

## 2024-07-02 ENCOUNTER — Ambulatory Visit: Attending: Cardiology | Admitting: Cardiology

## 2024-07-02 ENCOUNTER — Encounter: Payer: Self-pay | Admitting: Cardiology

## 2024-07-02 VITALS — BP 128/84 | HR 89 | Ht 68.0 in | Wt 213.6 lb

## 2024-07-02 DIAGNOSIS — E782 Mixed hyperlipidemia: Secondary | ICD-10-CM | POA: Diagnosis not present

## 2024-07-02 DIAGNOSIS — I517 Cardiomegaly: Secondary | ICD-10-CM | POA: Diagnosis not present

## 2024-07-02 DIAGNOSIS — Z86711 Personal history of pulmonary embolism: Secondary | ICD-10-CM | POA: Diagnosis not present

## 2024-07-02 DIAGNOSIS — I10 Essential (primary) hypertension: Secondary | ICD-10-CM | POA: Diagnosis not present

## 2024-07-02 NOTE — Patient Instructions (Addendum)
 Medication Instructions:  Your physician recommends that you continue on your current medications as directed. Please refer to the Current Medication list given to you today.  Labwork: none  Testing/Procedures: Your physician has requested that you have an echocardiogram in 2 months just before your next visit. Echocardiography is a painless test that uses sound waves to create images of your heart. It provides your doctor with information about the size and shape of your heart and how well your heart's chambers and valves are working. This procedure takes approximately one hour. There are no restrictions for this procedure. Please do NOT wear cologne, perfume, aftershave, or lotions (deodorant is allowed). Please arrive 15 minutes prior to your appointment time.  Please note: We ask at that you not bring children with you during ultrasound (echo/ vascular) testing. Due to room size and safety concerns, children are not allowed in the ultrasound rooms during exams. Our front office staff cannot provide observation of children in our lobby area while testing is being conducted. An adult accompanying a patient to their appointment will only be allowed in the ultrasound room at the discretion of the ultrasound technician under special circumstances. We apologize for any inconvenience.  Follow-Up: Your physician recommends that you schedule a follow-up appointment in: 2 months  Any Other Special Instructions Will Be Listed Below (If Applicable).  If you need a refill on your cardiac medications before your next appointment, please call your pharmacy.

## 2024-07-02 NOTE — Progress Notes (Signed)
    Cardiology Office Note  Date: 07/02/2024   ID: NAYAB ATEN, DOB November 30, 1946, MRN [id number]  History of Present Illness: Sarah Nguyen is a 78 y.o. female last seen in the office back in October 2022.  She presents to the office in follow-up of hospitalization at [facility].  I reviewed her records.  She was transferred from [facility] in late May with acute saddle pulmonary embolism, subsequently found to have bilateral DVTs and evidence of RV strain with moderate RV dysfunction by echocardiography.  She was anticoagulated and underwent thrombectomy on June 1, subsequently placed on Xarelto.  She has done very well, not on any supplemental oxygen and reports baseline NYHA class I-II dyspnea.  She has not had any exertional chest pain or syncope.  We went over her medications.  She reports compliance with Xarelto, no spontaneous bleeding problems.  I reviewed her echocardiogram from June which is noted below.  Physical Exam: VS:  BP 128/84 (BP Location: Left Arm)   Pulse 89   Ht 5' 8 (1.727 m)   Wt 213 lb 9.6 oz (96.9 kg)   SpO2 95%   BMI 32.48 kg/m , BMI Body mass index is 32.48 kg/m.  Wt Readings from Last 3 Encounters:  07/02/24 213 lb 9.6 oz (96.9 kg)  05/27/24 209 lb 3.5 oz (94.9 kg)  05/22/23 213 lb 9.6 oz (96.9 kg)    General: Patient appears comfortable at rest. HEENT: Conjunctiva and lids normal. Neck: Supple, no elevated JVP or carotid bruits. Lungs: Clear to auscultation, nonlabored breathing at rest. Cardiac: Regular rate and rhythm, no S3 or significant systolic murmur, no pericardial rub. Extremities: Mild left ankle edema.  ECG:  An ECG dated 05/15/2024 was personally reviewed today and demonstrated:  Sinus rhythm with leftward axis and nonspecific T wave changes.  Labwork:  June 2025: BUN 22, creatinine 0.92, GFR 65, potassium 3.9, AST 19, ALT 15, cholesterol 195, triglycerides 131, HDL 55, LDL 117, hemoglobin A1c 6.3%  Other Studies Reviewed  Today:  Echocardiogram 05/16/2024: Summary   1. The left ventricle is normal in size with normal wall thickness.    2. The left ventricular systolic function is normal, LVEF is visually  estimated at > 55%.    3. Mitral annular calcification is present (mild).    4. The right ventricle is moderately dilated in size, with moderately  reduced systolic function. McConnell's sign is present.   Assessment and Plan:  1.  Diagnosis of unprovoked acute saddle pulmonary embolus and bilateral DVTs in June as outlined above status post thrombectomy and now on Xarelto.  She had evidence of RV strain and moderate RV dysfunction by echocardiography, LV contraction was normal.  Clinically stable at this time and continues on Xarelto 20 mg daily.  Would anticipate lifelong anticoagulation given life-threatening thromboembolic event as well as her prior history of DVT in 2022 (also history in sister).  Thrombophilia workup would not change this recommendation even if negative.  Would expect RV function to improve, we will obtain an echocardiogram with clinical visit in the next 2 months.  2.  Primary hypertension.  Blood pressure is reasonably well-controlled today.  Continue Hyzaar 50/12.5 mg daily and Toprol -XL 25 mg daily.   3.  Mixed hyperlipidemia.  Continue Lipitor 20 mg daily.  Disposition:  Follow up 2 months.  Signed, Jayson JUDITHANN Sierras, M.D., F.A.C.C. [facility] at [facility]

## 2024-08-17 ENCOUNTER — Ambulatory Visit: Attending: Cardiology

## 2024-08-17 DIAGNOSIS — Z86711 Personal history of pulmonary embolism: Secondary | ICD-10-CM

## 2024-08-17 DIAGNOSIS — I2602 Saddle embolus of pulmonary artery with acute cor pulmonale: Secondary | ICD-10-CM

## 2024-08-17 DIAGNOSIS — I517 Cardiomegaly: Secondary | ICD-10-CM

## 2024-08-18 ENCOUNTER — Ambulatory Visit: Payer: Self-pay | Admitting: Cardiology

## 2024-08-18 LAB — ECHOCARDIOGRAM COMPLETE
AR max vel: 2.12 cm2
AV Peak grad: 4.2 mmHg
Ao pk vel: 1.02 m/s
Area-P 1/2: 4.74 cm2
Calc EF: 53.7 %
S' Lateral: 3.1 cm
Single Plane A2C EF: 46.8 %
Single Plane A4C EF: 57.1 %

## 2024-08-25 ENCOUNTER — Ambulatory Visit: Attending: Cardiology | Admitting: Cardiology

## 2024-08-25 ENCOUNTER — Encounter: Payer: Self-pay | Admitting: Cardiology

## 2024-08-25 VITALS — BP 112/68 | HR 81 | Ht 68.0 in | Wt 216.0 lb

## 2024-08-25 DIAGNOSIS — Z86711 Personal history of pulmonary embolism: Secondary | ICD-10-CM | POA: Diagnosis not present

## 2024-08-25 DIAGNOSIS — I10 Essential (primary) hypertension: Secondary | ICD-10-CM

## 2024-08-25 DIAGNOSIS — E782 Mixed hyperlipidemia: Secondary | ICD-10-CM

## 2024-08-25 NOTE — Patient Instructions (Signed)
 Medication Instructions:  Your physician recommends that you continue on your current medications as directed. Please refer to the Current Medication list given to you today.   Labwork: None today  Testing/Procedures: None today  Follow-Up: 6 months  Any Other Special Instructions Will Be Listed Below (If Applicable).  If you need a refill on your cardiac medications before your next appointment, please call your pharmacy.

## 2024-08-25 NOTE — Progress Notes (Signed)
 Cardiology Office Note  Date: 08/25/2024   ID: Sarah Nguyen, DOB 04-27-1946, MRN [id number]  History of Present Illness: Sarah Nguyen is a 78 y.o. female last seen in July.  She is here for a follow-up visit.  States that she has been feeling well, no unusual shortness of breath, no cough or hemoptysis, no chest pain.  Medications reviewed.  She reports compliance with her medications, remains on Xarelto with no spontaneous bleeding problems.  Follow-up echocardiogram in July showed LVEF 50 to 55% with mild RV dysfunction and normal estimated RVSP.  RV function improving relative to diagnosis of pulmonary embolus.  She does not report any peripheral edema.  We went over these results today.  Physical Exam: VS:  BP 112/68 (BP Location: Right Arm, Patient Position: Sitting, Cuff Size: Large)   Pulse 81   Ht 5' 8 (1.727 m)   Wt 216 lb (98 kg)   SpO2 95%   BMI 32.84 kg/m , BMI Body mass index is 32.84 kg/m.  Wt Readings from Last 3 Encounters:  08/25/24 216 lb (98 kg)  07/02/24 213 lb 9.6 oz (96.9 kg)  05/27/24 209 lb 3.5 oz (94.9 kg)    General: Patient appears comfortable at rest. HEENT: Conjunctiva and lids normal. Neck: Supple, no elevated JVP or carotid bruits. Lungs: Clear to auscultation, nonlabored breathing at rest. Cardiac: Regular rate and rhythm, no S3 or significant systolic murmur.  ECG:  An ECG dated 05/15/2024 was personally reviewed today and demonstrated:  Sinus rhythm with leftward axis and nonspecific T wave changes.  Labwork:  June 2025: BUN 22, creatinine 0.92, GFR 65, potassium 3.9, AST 19, ALT 15, cholesterol 195, triglycerides 131, HDL 55, LDL 117, hemoglobin A1c 6.3%   Other Studies Reviewed Today:  Echocardiogram 08/17/2024:  1. Left ventricular ejection fraction, by estimation, is 50 to 55%. Left  ventricular ejection fraction by 3D volume is 51 %. The left ventricle has  low normal function. Left ventricular endocardial border not optimally   defined to evaluate regional wall  motion. Left ventricular diastolic parameters are consistent with Grade I  diastolic dysfunction (impaired relaxation).   2. Right ventricular systolic function is not well visualized but appears  to be mildly reduced. The right ventricular size is moderately enlarged.  There is normal pulmonary artery systolic pressure.   3. The mitral valve is abnormal. No evidence of mitral valve  regurgitation. No evidence of mitral stenosis. The mean mitral valve  gradient is 2.0 mmHg. Severe mitral annular calcification.   4. The aortic valve is tricuspid. Aortic valve regurgitation is not  visualized. No aortic stenosis is present.   5. Aortic dilatation noted. There is borderline dilatation of the aortic  root, measuring 38 mm. There is borderline dilatation of the ascending  aorta, measuring 38 mm.   6. The inferior vena cava is normal in size with greater than 50%  respiratory variability, suggesting right atrial pressure of 3 mmHg.   Assessment and Plan:  1.  Diagnosis of unprovoked acute saddle pulmonary embolus and bilateral DVTs in June status post thrombectomy and now on Xarelto 20 mg daily.  She had evidence of RV strain and moderate RV dysfunction by echocardiography, LV contraction was normal.  Follow-up echocardiogram shows mild RV dysfunction at this point and normal estimated RVSP.  She remains clinically stable, would plan to continue with long-term anticoagulation.   2.  Primary hypertension.  No changes made to current regimen.  Blood pressure is normal today.  3.  Mixed hyperlipidemia.  Continue Lipitor 20 mg daily.  Keep follow-up with PCP for review of lab work.  Disposition:  Follow up 6 months.  Signed, Jayson JUDITHANN Sierras, M.D., F.A.C.C. [facility] at [facility]

## 2024-11-23 ENCOUNTER — Inpatient Hospital Stay: Attending: Oncology

## 2024-11-23 DIAGNOSIS — G8929 Other chronic pain: Secondary | ICD-10-CM | POA: Diagnosis not present

## 2024-11-23 DIAGNOSIS — Z800 Family history of malignant neoplasm of digestive organs: Secondary | ICD-10-CM | POA: Diagnosis not present

## 2024-11-23 DIAGNOSIS — Z7901 Long term (current) use of anticoagulants: Secondary | ICD-10-CM | POA: Diagnosis not present

## 2024-11-23 DIAGNOSIS — R1310 Dysphagia, unspecified: Secondary | ICD-10-CM | POA: Diagnosis not present

## 2024-11-23 DIAGNOSIS — Z86711 Personal history of pulmonary embolism: Secondary | ICD-10-CM | POA: Diagnosis not present

## 2024-11-23 DIAGNOSIS — Z806 Family history of leukemia: Secondary | ICD-10-CM | POA: Diagnosis not present

## 2024-11-23 DIAGNOSIS — E782 Mixed hyperlipidemia: Secondary | ICD-10-CM | POA: Insufficient documentation

## 2024-11-23 DIAGNOSIS — R059 Cough, unspecified: Secondary | ICD-10-CM | POA: Diagnosis not present

## 2024-11-23 DIAGNOSIS — Z808 Family history of malignant neoplasm of other organs or systems: Secondary | ICD-10-CM | POA: Insufficient documentation

## 2024-11-23 DIAGNOSIS — M7630 Iliotibial band syndrome, unspecified leg: Secondary | ICD-10-CM | POA: Diagnosis not present

## 2024-11-23 DIAGNOSIS — Z803 Family history of malignant neoplasm of breast: Secondary | ICD-10-CM | POA: Insufficient documentation

## 2024-11-23 DIAGNOSIS — I119 Hypertensive heart disease without heart failure: Secondary | ICD-10-CM | POA: Diagnosis not present

## 2024-11-23 DIAGNOSIS — I2699 Other pulmonary embolism without acute cor pulmonale: Secondary | ICD-10-CM

## 2024-11-23 DIAGNOSIS — Z86718 Personal history of other venous thrombosis and embolism: Secondary | ICD-10-CM | POA: Insufficient documentation

## 2024-11-23 DIAGNOSIS — R197 Diarrhea, unspecified: Secondary | ICD-10-CM | POA: Diagnosis not present

## 2024-11-23 DIAGNOSIS — I824Z3 Acute embolism and thrombosis of unspecified deep veins of distal lower extremity, bilateral: Secondary | ICD-10-CM | POA: Diagnosis present

## 2024-11-23 LAB — CBC WITH DIFFERENTIAL/PLATELET
Abs Immature Granulocytes: 0.01 K/uL (ref 0.00–0.07)
Basophils Absolute: 0.1 K/uL (ref 0.0–0.1)
Basophils Relative: 1 %
Eosinophils Absolute: 0.1 K/uL (ref 0.0–0.5)
Eosinophils Relative: 1 %
HCT: 40.9 % (ref 36.0–46.0)
Hemoglobin: 13.2 g/dL (ref 12.0–15.0)
Immature Granulocytes: 0 %
Lymphocytes Relative: 36 %
Lymphs Abs: 1.7 K/uL (ref 0.7–4.0)
MCH: 30.6 pg (ref 26.0–34.0)
MCHC: 32.3 g/dL (ref 30.0–36.0)
MCV: 94.7 fL (ref 80.0–100.0)
Monocytes Absolute: 0.4 K/uL (ref 0.1–1.0)
Monocytes Relative: 8 %
Neutro Abs: 2.6 K/uL (ref 1.7–7.7)
Neutrophils Relative %: 54 %
Platelets: 265 K/uL (ref 150–400)
RBC: 4.32 MIL/uL (ref 3.87–5.11)
RDW: 14.6 % (ref 11.5–15.5)
WBC: 4.8 K/uL (ref 4.0–10.5)
nRBC: 0 % (ref 0.0–0.2)

## 2024-11-23 LAB — D-DIMER, QUANTITATIVE: D-Dimer, Quant: 0.35 ug{FEU}/mL (ref 0.00–0.50)

## 2024-11-24 LAB — CARDIOLIPIN ANTIBODIES, IGG, IGM, IGA
Anticardiolipin IgA: <9 U/mL (ref 0–11)
Anticardiolipin IgG: <9 GPL U/mL (ref 0–14)
Anticardiolipin IgM: <9 [MPL'U]/mL (ref 0–12)

## 2024-11-25 LAB — LUPUS ANTICOAGULANT PANEL

## 2024-11-25 LAB — BETA-2-GLYCOPROTEIN I ABS, IGG/M/A
Beta-2 Glyco I IgG: <9 GPI IgG units (ref 0–20)
Beta-2-Glycoprotein I IgA: <9 GPI IgA units (ref 0–25)
Beta-2-Glycoprotein I IgM: <9 GPI IgM units (ref 0–32)

## 2024-11-30 ENCOUNTER — Inpatient Hospital Stay: Admitting: Oncology

## 2024-11-30 ENCOUNTER — Other Ambulatory Visit: Payer: Self-pay | Admitting: *Deleted

## 2024-11-30 ENCOUNTER — Inpatient Hospital Stay

## 2024-11-30 VITALS — BP 113/68 | HR 76 | Temp 98.3°F | Resp 18 | Wt 207.0 lb

## 2024-11-30 DIAGNOSIS — I82401 Acute embolism and thrombosis of unspecified deep veins of right lower extremity: Secondary | ICD-10-CM

## 2024-11-30 DIAGNOSIS — I824Z3 Acute embolism and thrombosis of unspecified deep veins of distal lower extremity, bilateral: Secondary | ICD-10-CM | POA: Diagnosis not present

## 2024-11-30 DIAGNOSIS — I2699 Other pulmonary embolism without acute cor pulmonale: Secondary | ICD-10-CM

## 2024-11-30 DIAGNOSIS — I82409 Acute embolism and thrombosis of unspecified deep veins of unspecified lower extremity: Secondary | ICD-10-CM | POA: Insufficient documentation

## 2024-11-30 NOTE — Assessment & Plan Note (Addendum)
-   Recommended indefinite anticoagulation as she had 2 episodes of unprovoked DVT and saddle embolus. - All her screenings are up-to-date. - I have recommended indefinite anticoagulation with Xarelto. - Recommend follow-up in 6 months.   -Repeat labs from 11/23/2024 showed negative D-dimer, lupus anticoagulant, anticardiolipin antibodies, antibeta 2 glycoprotein 1 antibodies.  This rules out antiphospholipid syndrome. -Continue anticoagulation with Xarelto for now. -She denies any melena, hematochezia or bright red blood per rectum.  Tolerating well.

## 2024-11-30 NOTE — Progress Notes (Signed)
 [facility] OFFICE PROGRESS NOTE  Burdine, Elspeth BRAVO, MD  ASSESSMENT & PLAN:    Assessment & Plan Acute deep vein thrombosis (DVT) of right lower extremity, unspecified vein (HCC) - Recommended indefinite anticoagulation as she had 2 episodes of unprovoked DVT and saddle embolus. - All her screenings are up-to-date. - I have recommended indefinite anticoagulation with Xarelto. - Recommend follow-up in 6 months.   -Repeat labs from 11/23/2024 showed negative D-dimer, lupus anticoagulant, anticardiolipin antibodies, antibeta 2 glycoprotein 1 antibodies.  This rules out antiphospholipid syndrome. -Continue anticoagulation with Xarelto for now. -She denies any melena, hematochezia or bright red blood per rectum.  Tolerating well.  Orders Placed This Encounter  Procedures   D-dimer, quantitative    Standing Status:   Future    Expected Date:   05/31/2025    Expiration Date:   08/29/2025   CBC with Differential    Standing Status:   Future    Expected Date:   05/31/2025    Expiration Date:   08/29/2025    INTERVAL HISTORY: Patient returns for follow-up.  Patient denies any interval hospitalizations, surgeries or changes to baseline health.  She has met with cardiology last on 08/25/2024 for primary hypertension mixed hyperlipidemia and RV strain and moderate RV dysfunction by echocardiogram after recent PE/DVT.  Follow-up echocardiogram shows mild RV dysfunction and at this point and normal estimated RVSP.  She also met with orthopedic surgery on 10/04/2024 for chronic right hip pain in the setting of previously diagnosed grade trochanteric pain syndrome and IT band syndrome which has been managed nonoperatively with periodic steroid injections and oral medications.   Reports she receives intermittent spinal injections and questions whether or not she will have to come off her Xarelto for these in the future.  Her last injection was given in May 2025.  She typically does not get  another injection for over a year.  Reports she is tolerating her Xarelto well.  Denies any bright red blood per rectum, melena or hematochezia.  Overall, patient has done well.  He appetite levels are 100% energy levels are 25%.  She has pain in her back and joints rates it a 5 out of 10 and receives injections.  Has cough at times diarrhea at times and trouble swallowing intermittently.  Has dizziness when she stands too quickly and numbness and burning in her hands and feet.  Reports chronic left leg ankle swelling for years.  SUMMARY OF HEMATOLOGIC HISTORY: Oncology History   No problem history exists.   1.  Recurrent DVT and saddle pulmonary embolus: - First episode of unprovoked right leg DVT on 06/26/2021, with Doppler showing popliteal and right gastrocnemius occlusion, treated with 4 months of Xarelto. - Had a long road trip to [place]  around 04/25/2024, but got off the car frequently every 2 hours. - She was admitted to [facility] on 05/16/2023, transferred to [facility] as CT angiogram showed acute saddle type pulmonary embolism with nearly occlusive thrombus extending into the right lower lobe pulmonary artery.  Suggestion of right heart strain. - Bilateral leg ultrasound: Acute DVT in the right proximal veins and calf veins.  Acute DVT in the left proximal veins, calf veins and intramuscular calf veins. - She had thrombectomy done. - Xarelto was started.  She is tolerating it well. - Last mammogram on 03/12/2024: Normal.  Colonoscopy 6 years ago in [place] was reportedly normal.  She is a non-smoker. - She had a history of?  Cervical cancer at  age 46, underwent TAH.   2. Social/Family History: -Patient has a history of vaginal or cervical cancer in her 20's  s/p vaginal hysterectomy. No tobacco use. -No family history of lupus anticoagulant. Mother had a miscarriage. Her son has a history of DVT. Father had metastatic melanoma. Brother died of prostate cancer. Paternal  grandmother had leukemia. Paternal uncle had cancer, type unknown. Paternal aunt has had 6-7 different cancers. Paternal aunt died of breast cancer. Another paternal aunt had esophageal cancer.   CBC    Component Value Date/Time   WBC 4.8 11/23/2024 1324   RBC 4.32 11/23/2024 1324   HGB 13.2 11/23/2024 1324   HCT 40.9 11/23/2024 1324   PLT 265 11/23/2024 1324   MCV 94.7 11/23/2024 1324   MCH 30.6 11/23/2024 1324   MCHC 32.3 11/23/2024 1324   RDW 14.6 11/23/2024 1324   LYMPHSABS 1.7 11/23/2024 1324   MONOABS 0.4 11/23/2024 1324   EOSABS 0.1 11/23/2024 1324   BASOSABS 0.1 11/23/2024 1324       Latest Ref Rng & Units 03/18/2016    2:07 PM  CMP  Glucose 65 - 99 mg/dL 869   BUN 6 - 20 mg/dL 17   Creatinine 9.55 - 1.00 mg/dL 9.02   Sodium 864 - 854 mmol/L 142   Potassium 3.5 - 5.1 mmol/L 4.0   Chloride 101 - 111 mmol/L 106   CO2 22 - 32 mmol/L 22   Calcium  8.9 - 10.3 mg/dL 9.3      No results found for: FERRITIN, VITAMINB12  Vitals:   11/30/24 1339  BP: 113/68  Pulse: 76  Resp: 18  Temp: 98.3 F (36.8 C)  SpO2: 95%    Review of System:  Review of Systems  Constitutional:  Positive for malaise/fatigue.  Respiratory:  Positive for cough.   Gastrointestinal:  Positive for diarrhea.  Neurological:  Positive for dizziness and tingling.    Physical Exam: Physical Exam Constitutional:      Appearance: Normal appearance.  HENT:     Head: Normocephalic and atraumatic.  Eyes:     Pupils: Pupils are equal, round, and reactive to light.  Cardiovascular:     Rate and Rhythm: Normal rate and regular rhythm.     Heart sounds: Normal heart sounds. No murmur heard. Pulmonary:     Effort: Pulmonary effort is normal.     Breath sounds: Normal breath sounds. No wheezing.  Abdominal:     General: Bowel sounds are normal. There is no distension.     Palpations: Abdomen is soft.     Tenderness: There is no abdominal tenderness.  Musculoskeletal:        General: Normal  range of motion.     Cervical back: Normal range of motion.  Skin:    General: Skin is warm and dry.     Findings: No rash.  Neurological:     Mental Status: She is alert and oriented to person, place, and time.     Gait: Gait is intact.  Psychiatric:        Mood and Affect: Mood and affect normal.        Cognition and Memory: Memory normal.        Judgment: Judgment normal.      I spent 20 minutes dedicated to the care of this patient (face-to-face and non-face-to-face) on the date of the encounter to include what is described in the assessment and plan.,  Delon Hope, NP 11/30/2024 3:14 PM

## 2024-12-01 LAB — DRVVT MIX: dRVVT Mix: 47.6 s — ABNORMAL HIGH (ref 0.0–40.4)

## 2024-12-01 LAB — LUPUS ANTICOAGULANT PANEL
DRVVT: 60.2 s — ABNORMAL HIGH (ref 0.0–47.0)
PTT Lupus Anticoagulant: 31.5 s (ref 0.0–43.5)

## 2024-12-01 LAB — DRVVT CONFIRM: dRVVT Confirm: 1.2 ratio (ref 0.8–1.2)

## 2024-12-13 ENCOUNTER — Encounter: Payer: Self-pay | Admitting: *Deleted

## 2025-01-18 ENCOUNTER — Other Ambulatory Visit (HOSPITAL_COMMUNITY): Payer: Self-pay | Admitting: General Practice

## 2025-01-18 DIAGNOSIS — Z1231 Encounter for screening mammogram for malignant neoplasm of breast: Secondary | ICD-10-CM

## 2025-02-22 ENCOUNTER — Ambulatory Visit: Admitting: Cardiology

## 2025-03-14 ENCOUNTER — Ambulatory Visit (HOSPITAL_COMMUNITY)

## 2025-05-24 ENCOUNTER — Inpatient Hospital Stay

## 2025-05-31 ENCOUNTER — Inpatient Hospital Stay: Admitting: Oncology
